# Patient Record
Sex: Female | Born: 1995 | Race: Black or African American | Hispanic: No | Marital: Single | Smoking: Never smoker
Health system: Southern US, Community
[De-identification: ages and names within clinical notes are randomized; demographics above are authoritative.]

## PROBLEM LIST (undated history)

## (undated) DIAGNOSIS — F909 Attention-deficit hyperactivity disorder, unspecified type: Secondary | ICD-10-CM

## (undated) DIAGNOSIS — F39 Unspecified mood [affective] disorder: Secondary | ICD-10-CM

---
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
## 2019-07-29 ENCOUNTER — Observation Stay (HOSPITAL_COMMUNITY)
Admission: EM | Admit: 2019-07-29 | Discharge: 2019-07-30 | Disposition: A | Discharge status: Home / Self Care | Payer: Self-pay | Attending: Orthopedic Surgery | Admitting: Orthopedic Surgery

## 2019-07-29 ENCOUNTER — Emergency Department (HOSPITAL_COMMUNITY): Payer: Self-pay

## 2019-07-29 ENCOUNTER — Encounter (HOSPITAL_COMMUNITY): Payer: Self-pay | Admitting: Emergency Medicine

## 2019-07-29 ENCOUNTER — Other Ambulatory Visit: Payer: Self-pay

## 2019-07-29 DIAGNOSIS — Y92481 Parking lot as the place of occurrence of the external cause: Secondary | ICD-10-CM | POA: Insufficient documentation

## 2019-07-29 DIAGNOSIS — W000XXA Fall on same level due to ice and snow, initial encounter: Secondary | ICD-10-CM | POA: Insufficient documentation

## 2019-07-29 DIAGNOSIS — S83106A Unspecified dislocation of unspecified knee, initial encounter: Secondary | ICD-10-CM | POA: Diagnosis present

## 2019-07-29 DIAGNOSIS — W19XXXA Unspecified fall, initial encounter: Secondary | ICD-10-CM

## 2019-07-29 DIAGNOSIS — Z79899 Other long term (current) drug therapy: Secondary | ICD-10-CM | POA: Insufficient documentation

## 2019-07-29 DIAGNOSIS — Z20822 Contact with and (suspected) exposure to covid-19: Secondary | ICD-10-CM | POA: Insufficient documentation

## 2019-07-29 DIAGNOSIS — S83104A Unspecified dislocation of right knee, initial encounter: Principal | ICD-10-CM | POA: Insufficient documentation

## 2019-07-29 DIAGNOSIS — F909 Attention-deficit hyperactivity disorder, unspecified type: Secondary | ICD-10-CM | POA: Insufficient documentation

## 2019-07-29 LAB — CBC
HCT: 40.2 % (ref 36.0–46.0)
Hemoglobin: 13.5 g/dL (ref 12.0–15.0)
MCH: 31.7 pg (ref 26.0–34.0)
MCHC: 33.6 g/dL (ref 30.0–36.0)
MCV: 94.4 fL (ref 80.0–100.0)
Platelets: 326 10*3/uL (ref 150–400)
RBC: 4.26 MIL/uL (ref 3.87–5.11)
RDW: 11.2 % — ABNORMAL LOW (ref 11.5–15.5)
WBC: 8.8 10*3/uL (ref 4.0–10.5)
nRBC: 0 % (ref 0.0–0.2)

## 2019-07-29 LAB — COMPREHENSIVE METABOLIC PANEL
ALT: 25 U/L (ref 0–44)
AST: 23 U/L (ref 15–41)
Albumin: 4.3 g/dL (ref 3.5–5.0)
Alkaline Phosphatase: 48 U/L (ref 38–126)
Anion gap: 8 (ref 5–15)
BUN: 7 mg/dL (ref 6–20)
CO2: 25 mmol/L (ref 22–32)
Calcium: 9 mg/dL (ref 8.9–10.3)
Chloride: 103 mmol/L (ref 98–111)
Creatinine, Ser: 0.63 mg/dL (ref 0.44–1.00)
GFR calc Af Amer: >60 mL/min (ref >60)
GFR calc non Af Amer: >60 mL/min (ref >60)
Glucose, Bld: 113 mg/dL — ABNORMAL HIGH (ref 70–99)
Potassium: 3.4 mmol/L — ABNORMAL LOW (ref 3.5–5.1)
Sodium: 136 mmol/L (ref 135–145)
Total Bilirubin: 0.5 mg/dL (ref 0.3–1.2)
Total Protein: 8.1 g/dL (ref 6.5–8.1)

## 2019-07-29 LAB — PROTIME-INR
INR: 1 (ref 0.8–1.2)
Prothrombin Time: 13.3 seconds (ref 11.4–15.2)

## 2019-07-29 MED ORDER — DOCUSATE SODIUM 100 MG PO CAPS
100.0000 mg | ORAL_CAPSULE | Freq: Two times a day (BID) | ORAL | Status: DC
  Administered 2019-07-29: 100 mg via ORAL
  Filled 2019-07-29: qty 1

## 2019-07-29 MED ORDER — TRAZODONE HCL 50 MG PO TABS
50.0000 mg | ORAL_TABLET | Freq: Every day | ORAL | Status: DC
  Administered 2019-07-29: 50 mg via ORAL
  Filled 2019-07-29: qty 1

## 2019-07-29 MED ORDER — ACETAMINOPHEN 500 MG PO TABS: 500.0000 mg | ORAL_TABLET | Freq: Four times a day (QID) | ORAL | Status: DC

## 2019-07-29 MED ORDER — HYDROCODONE-ACETAMINOPHEN 5-325 MG PO TABS: 1.0000 | ORAL_TABLET | ORAL | Status: DC | PRN

## 2019-07-29 MED ORDER — HYDROCODONE-ACETAMINOPHEN 7.5-325 MG PO TABS: 1.0000 | ORAL_TABLET | ORAL | Status: DC | PRN

## 2019-07-29 MED ORDER — DIPHENHYDRAMINE HCL 12.5 MG/5ML PO ELIX: 12.5000 mg | ORAL_SOLUTION | ORAL | Status: DC | PRN

## 2019-07-29 MED ORDER — TRAMADOL HCL 50 MG PO TABS
50.0000 mg | ORAL_TABLET | Freq: Four times a day (QID) | ORAL | Status: DC
  Administered 2019-07-30: 11:00:00 50 mg via ORAL
  Filled 2019-07-29: qty 1

## 2019-07-29 MED ORDER — OXCARBAZEPINE 150 MG PO TABS
150.0000 mg | ORAL_TABLET | Freq: Every day | ORAL | Status: DC
  Filled 2019-07-29 (×3): qty 1

## 2019-07-29 MED ORDER — METHOCARBAMOL 500 MG PO TABS: 500.0000 mg | ORAL_TABLET | Freq: Four times a day (QID) | ORAL | Status: DC | PRN

## 2019-07-29 MED ORDER — GABAPENTIN 300 MG PO CAPS
300.0000 mg | ORAL_CAPSULE | Freq: Three times a day (TID) | ORAL | Status: DC
  Administered 2019-07-29: 300 mg via ORAL
  Filled 2019-07-29: qty 1

## 2019-07-29 MED ORDER — METHOCARBAMOL 1000 MG/10ML IJ SOLN
500.0000 mg | Freq: Four times a day (QID) | INTRAVENOUS | Status: DC | PRN
  Filled 2019-07-29: qty 5

## 2019-07-29 MED ORDER — PROPOFOL 10 MG/ML IV BOLUS: 0.5000 mg/kg | Freq: Once | INTRAVENOUS | Status: AC

## 2019-07-29 MED ORDER — PROPOFOL 10 MG/ML IV BOLUS
INTRAVENOUS | Status: AC | PRN
  Administered 2019-07-29 (×3): 40 mg via INTRAVENOUS

## 2019-07-29 MED ORDER — HYDROMORPHONE HCL 1 MG/ML IJ SOLN
INTRAMUSCULAR | Status: AC
  Administered 2019-07-29: 1 mg via INTRAVENOUS
  Filled 2019-07-29: qty 1

## 2019-07-29 MED ORDER — PROPOFOL 10 MG/ML IV BOLUS
INTRAVENOUS | Status: AC
  Filled 2019-07-29: qty 20

## 2019-07-29 MED ORDER — ACETAMINOPHEN 325 MG PO TABS: 325.0000 mg | ORAL_TABLET | Freq: Four times a day (QID) | ORAL | Status: DC | PRN

## 2019-07-29 MED ORDER — MORPHINE SULFATE (PF) 2 MG/ML IV SOLN
0.5000 mg | INTRAVENOUS | Status: DC | PRN
  Administered 2019-07-29 – 2019-07-30 (×4): 1 mg via INTRAVENOUS
  Filled 2019-07-29 (×4): qty 1

## 2019-07-29 MED ORDER — LISDEXAMFETAMINE DIMESYLATE 10 MG PO CAPS: 30.0000 mg | ORAL_CAPSULE | Freq: Every day | ORAL | Status: DC

## 2019-07-29 MED ORDER — SODIUM CHLORIDE 0.9 % IV SOLN: INTRAVENOUS | Status: DC

## 2019-07-29 MED ORDER — HYDROMORPHONE HCL 1 MG/ML IJ SOLN: 1.0000 mg | Freq: Once | INTRAMUSCULAR | Status: AC

## 2019-07-29 MED ORDER — CELECOXIB 100 MG PO CAPS
200.0000 mg | ORAL_CAPSULE | Freq: Two times a day (BID) | ORAL | Status: DC
  Filled 2019-07-29 (×5): qty 1

## 2019-07-29 NOTE — H&P (Signed)
Jill Tran is an 24 y.o. female.   Chief Complaint: right knee pain  HPI: 24 yo female hyperextended the left knee presented to the ER with right knee pain and deformity   Initial films showed anterior knee dislocation   I advised to attempt immediate closed reduction which was successful   She has no neurological deficits papation of pulses are equal but faint   abi was 1.2 after reduction and doppler pulses are strong in DP and PT   Compartments are soft  Past Medical History:  Diagnosis Date  . ADHD (attention deficit hyperactivity disorder)   . Mood disorder ([facility])     History reviewed. No pertinent surgical history.  No family history on file. Social History:  reports that she has never smoked. She has never used smokeless tobacco. She reports current drug use. Drug: Marijuana. She reports that she does not drink alcohol.  Allergies: No Known Allergies  (Not in a hospital admission)   No results found for this or any previous visit (from the past 48 hour(s)). DG Knee 2 Views Right  Result Date: 07/29/2019 CLINICAL DATA:  Dislocation, postreduction. EXAM: RIGHT KNEE - 1-2 VIEW COMPARISON:  Radiograph earlier this day. FINDINGS: Previous posterior knee dislocation has been reduced. The alignment is currently anatomic. Previous patellar Barry Brunner is no longer seen. No visualized fracture. Small knee joint effusion. Generalized soft tissue edema. IMPRESSION: Reduction of posterior knee dislocation.  No visualized fracture. Electronically Signed   By: Narda Rutherford M.D.   On: 07/29/2019 23:01   DG Knee 1-2 Views Right  Result Date: 07/29/2019 CLINICAL DATA:  Slip on ice at [employer]. Fall with right lower leg deformity. EXAM: RIGHT KNEE - 1-2 VIEW COMPARISON:  None. FINDINGS: Posterior and lateral dislocation of the femur with respect to the tibia. Patellar [place], patella otherwise remains normally aligned. No evidence of acute fracture. IMPRESSION: Posterior and lateral dislocation  of the femur with respect to the tibia. No evidence of acute fracture. Recommend correlation with lower extremity pulse exam given increased incidence of vascular injury with knee dislocations. Electronically Signed   By: Narda Rutherford M.D.   On: 07/29/2019 22:19   DG Tibia/Fibula Right  Result Date: 07/29/2019 CLINICAL DATA:  Slip on ice at [employer]. Fall with right lower leg deformity. EXAM: RIGHT TIBIA AND FIBULA - 2 VIEW COMPARISON:  None. FINDINGS: Knee dislocation better assessed on concurrent knee exam. No evidence of fracture of the tibia or fibula. Ankle alignment better assessed on ankle exam. Generalized soft tissue edema. IMPRESSION: No acute fracture of the tibia or fibula. Knee and ankle alignment better assessed on dedicated joint exams performed concurrently. Electronically Signed   By: Narda Rutherford M.D.   On: 07/29/2019 22:22   DG Ankle 2 Views Right  Result Date: 07/29/2019 CLINICAL DATA:  Slip on ice at [employer]. Fall with right lower leg deformity. EXAM: RIGHT ANKLE - 2 VIEW COMPARISON:  None. FINDINGS: Question of subtalar dislocation, talocalcaneal alignment appears abnormal on the AP view, however alignment appears normal on the lateral view. No visualized fracture. No ankle joint effusion. Ankle mortise is preserved. IMPRESSION: Question of subtalar dislocation, talocalcaneal alignment appears abnormal on the AP view, however alignment appears normal on the lateral view. Recommend correlation with physical exam. Consider CT based on clinical concern. Electronically Signed   By: Narda Rutherford M.D.   On: 07/29/2019 22:24    Review of Systems  All other systems reviewed and are negative.   Blood pressure (!) 138/98,  pulse 60, temperature 98.6 F (37 C), temperature source Oral, resp. rate (!) 22, height 5\' 2"  (1.575 m), weight 113.4 kg, last menstrual period 07/29/2019, SpO2 100 %. Physical Exam  Constitutional: She is oriented to person, place, and time. She appears  well-developed. She appears distressed.  HENT:  Head: Normocephalic and atraumatic.  Right Ear: External ear normal.  Left Ear: External ear normal.  Mouth/Throat: No oropharyngeal exudate.  Eyes: Pupils are equal, round, and reactive to light. Conjunctivae and EOM are normal. Right eye exhibits no discharge. Left eye exhibits no discharge. Scleral icterus is present.  Neck: No JVD present. No tracheal deviation present.  Cardiovascular: Normal rate and intact distal pulses.  Respiratory: Effort normal. No stridor.  GI: Soft. She exhibits no distension. There is no abdominal tenderness. There is no rebound.  Musculoskeletal:     Right shoulder: No swelling, deformity, effusion, laceration, tenderness, bony tenderness, crepitus, pain or spasms. Normal range of motion. Normal strength. Normal pulse.     Left shoulder: Normal. No swelling, deformity, laceration, tenderness, bony tenderness, crepitus, pain or spasms. Normal range of motion. Normal strength. Normal pulse.     Cervical back: Normal range of motion and neck supple.     Right knee: Swelling, deformity, effusion and bony tenderness present. No erythema, ecchymosis or lacerations. Decreased range of motion. Tenderness present. Abnormal alignment.     Left knee: No swelling, effusion, erythema, ecchymosis, lacerations or bony tenderness. Normal range of motion. No tenderness. No medial joint line, lateral joint line, MCL, LCL or patellar tendon tenderness. No MCL laxity.Normal alignment, normal meniscus and normal patellar mobility.  Lymphadenopathy:    She has no cervical adenopathy.  Neurological: She is alert and oriented to person, place, and time. No cranial nerve deficit. She exhibits normal muscle tone. Coordination normal.  Skin: Skin is warm and dry. No rash noted. She is not diaphoretic. No erythema. No pallor.  Psychiatric: She has a normal mood and affect. Her behavior is normal. Judgment and thought content normal.      Assessment/Plan  Anterior knee dislocation is my interpretation of the xrays   Post reduction knee reduced  Interpretation of 2nd xrays    Right knee anterior knee dislocation   Observe for neurovascular status   Arther Abbott, MD 07/29/2019, 11:06 PM

## 2019-07-29 NOTE — ED Notes (Signed)
X-ray at bedside

## 2019-07-29 NOTE — ED Triage Notes (Signed)
At [facility], slid in ice legs went in front of you and down  Now with obvious deformities R lower leg

## 2019-07-29 NOTE — ED Provider Notes (Signed)
[facility] EMERGENCY DEPARTMENT Provider Note   CSN: [id number] Arrival date & time: 07/29/19  2129     History Chief Complaint  Patient presents with  . Fall  . Leg Pain    R    Jill Tran is a 24 y.o. female presenting with right lower leg and ankle pain with deformity since slipping on ice in a parking lot at a local store.  She describes the right leg sliding forward, then her upper body fell forward as well.  She is unsure of how her leg landed "it happened so fast".   She reports pain in her right lower leg that radiates to her knee when she flexes the right foot. She denies hip or back pain and denies head injury.  She has had no treatments prior to arrival for her injury.   HPI     Past Medical History:  Diagnosis Date  . ADHD (attention deficit hyperactivity disorder)   . Mood disorder ([place])     There are no problems to display for this patient.   History reviewed. No pertinent surgical history.   OB History   No obstetric history on file.     No family history on file.  Social History   Tobacco Use  . Smoking status: Never Smoker  . Smokeless tobacco: Never Used  Substance Use Topics  . Alcohol use: No  . Drug use: Yes    Types: Marijuana    Home Medications Prior to Admission medications   Medication Sig Start Date End Date Taking? Authorizing Provider  lisdexamfetamine (VYVANSE) 30 MG capsule Take 30 mg by mouth daily.   Yes [provider]  OXcarbazepine (TRILEPTAL) 150 MG tablet Take 150 mg by mouth daily.   Yes [provider]  traZODone (DESYREL) 50 MG tablet Take 50 mg by mouth at bedtime.   Yes [provider]  amoxicillin-clavulanate (AUGMENTIN) 875-125 MG per tablet Take 1 tablet by mouth 2 (two) times daily. 07/08/14   Harden Mo, MD  LACTOBACILLUS EXTRA STRENGTH CAPS Take 1 twice daily 07/10/14   Harden Mo, MD    Allergies    Patient has no known allergies.  Review of Systems   Review of  Systems  Constitutional: Negative.   HENT: Negative.   Respiratory: Negative.   Cardiovascular: Negative.   Gastrointestinal: Negative.   Musculoskeletal: Positive for arthralgias and joint swelling.  Skin: Negative for wound.  Neurological: Negative for weakness and numbness.    Physical Exam Updated Vital Signs BP 131/82   Pulse 69   Temp 98.6 F (37 C) (Oral)   Resp 18   Ht 5\' 2"  (1.575 m)   Wt 113.4 kg   LMP 07/29/2019   SpO2 100%   BMI 45.73 kg/m   Physical Exam Vitals and nursing note reviewed.  Constitutional:      Appearance: She is well-developed.  HENT:     Head: Normocephalic.  Cardiovascular:     Rate and Rhythm: Normal rate.     Pulses: No decreased pulses.          Dorsalis pedis pulses are 2+ on the right side and 2+ on the left side.       Posterior tibial pulses are 2+ on the right side and 2+ on the left side.  Musculoskeletal:        General: Tenderness present.     Right knee: Bony tenderness present. Tenderness present over the medial joint line, lateral joint line and patellar  tendon. Abnormal alignment.     Right ankle: No swelling or ecchymosis. Tenderness present. Decreased range of motion.     Comments:  Right foot held in internal rotation.  Skin:    General: Skin is warm and dry.  Neurological:     Mental Status: She is alert.     Sensory: No sensory deficit.     ED Results / Procedures / Treatments   Labs (all labs ordered are listed, but only abnormal results are displayed) Labs Reviewed  HCG, QUANTITATIVE, PREGNANCY    EKG None  Radiology DG Knee 1-2 Views Right  Result Date: 07/29/2019 CLINICAL DATA:  Slip on ice at [employer]. Fall with right lower leg deformity. EXAM: RIGHT KNEE - 1-2 VIEW COMPARISON:  None. FINDINGS: Posterior and lateral dislocation of the femur with respect to the tibia. Patellar [place], patella otherwise remains normally aligned. No evidence of acute fracture. IMPRESSION: Posterior and lateral dislocation  of the femur with respect to the tibia. No evidence of acute fracture. Recommend correlation with lower extremity pulse exam given increased incidence of vascular injury with knee dislocations. Electronically Signed   By: Narda Rutherford M.D.   On: 07/29/2019 22:19   DG Tibia/Fibula Right  Result Date: 07/29/2019 CLINICAL DATA:  Slip on ice at [employer]. Fall with right lower leg deformity. EXAM: RIGHT TIBIA AND FIBULA - 2 VIEW COMPARISON:  None. FINDINGS: Knee dislocation better assessed on concurrent knee exam. No evidence of fracture of the tibia or fibula. Ankle alignment better assessed on ankle exam. Generalized soft tissue edema. IMPRESSION: No acute fracture of the tibia or fibula. Knee and ankle alignment better assessed on dedicated joint exams performed concurrently. Electronically Signed   By: Narda Rutherford M.D.   On: 07/29/2019 22:22   DG Ankle 2 Views Right  Result Date: 07/29/2019 CLINICAL DATA:  Slip on ice at [employer]. Fall with right lower leg deformity. EXAM: RIGHT ANKLE - 2 VIEW COMPARISON:  None. FINDINGS: Question of subtalar dislocation, talocalcaneal alignment appears abnormal on the AP view, however alignment appears normal on the lateral view. No visualized fracture. No ankle joint effusion. Ankle mortise is preserved. IMPRESSION: Question of subtalar dislocation, talocalcaneal alignment appears abnormal on the AP view, however alignment appears normal on the lateral view. Recommend correlation with physical exam. Consider CT based on clinical concern. Electronically Signed   By: Narda Rutherford M.D.   On: 07/29/2019 22:24    Procedures Procedures (including critical care time)  Medications Ordered in ED Medications  HYDROmorphone (DILAUDID) injection 1 mg (1 mg Intravenous Not Given 07/29/19 2300)  propofol (DIPRIVAN) 10 mg/mL bolus/IV push 56.7 mg ( Intravenous Given 07/29/19 2250)  propofol (DIPRIVAN) 10 mg/mL bolus/IV push (40 mg Intravenous Given 07/29/19 2233)     ED Course  I have reviewed the triage vital signs and the nursing notes.  Pertinent labs & imaging results that were available during my care of the patient were reviewed by me and considered in my medical decision making (see chart for details).     MDM Rules/Calculators/A&P                       Pt with dislocated right knee joint.  Discussed with Dr. Romeo Apple who requests immediate reduction. He will see pt in ed.  Please refer to Dr. Sandi Carne note for conscious sedation and reduction notes.    Dr. Romeo Apple to admit pt.  Final Clinical Impression(s) / ED Diagnoses Final diagnoses:  Knee dislocation, right,  initial encounter    Rx / DC Orders ED Discharge Orders    None       Victoriano Lain 07/29/19 2302    Geoffery Lyons, MD 08/01/19 [phone]

## 2019-07-30 ENCOUNTER — Encounter (HOSPITAL_COMMUNITY): Payer: Self-pay | Admitting: Orthopedic Surgery

## 2019-07-30 LAB — HIV ANTIBODY (ROUTINE TESTING W REFLEX): HIV Screen 4th Generation wRfx: NONREACTIVE

## 2019-07-30 LAB — SARS CORONAVIRUS 2 (TAT 6-24 HRS): SARS Coronavirus 2: NEGATIVE

## 2019-07-30 LAB — HCG, QUANTITATIVE, PREGNANCY: hCG, Beta Chain, Quant, S: <1 m[IU]/mL (ref <5)

## 2019-07-30 MED ORDER — HYDROCODONE-ACETAMINOPHEN 5-325 MG PO TABS: 1.5000 | ORAL_TABLET | ORAL | Status: DC | PRN

## 2019-07-30 MED ORDER — HYDROCODONE-ACETAMINOPHEN 5-325 MG PO TABS: 1.0000 | ORAL_TABLET | ORAL | 0 refills | Status: DC | PRN

## 2019-07-30 MED ORDER — IBUPROFEN 800 MG PO TABS: 800.0000 mg | ORAL_TABLET | Freq: Three times a day (TID) | ORAL | 0 refills | Status: DC | PRN

## 2019-07-30 NOTE — ED Notes (Signed)
Pt at bedside

## 2019-07-30 NOTE — Discharge Instructions (Signed)
Knee Dislocation  Knee dislocation is when the bones that make up the knee joint move out of their normal positions. Usually, if your knee is dislocated, at least two of the strong cords that hold your bones in place (ligaments) are also damaged. This is a serious injury that is an emergency. Your doctor needs to put the knee bones back in place right away. This may be done with or without surgery. What are the causes?  A high-speed force that causes your knee to twist, bend, or extend more than the ligaments can withstand. It may happen from: ? A direct hit from a car accident or while playing sports. ? An injury, such as stepping in a hole in the ground and twisting your knee. What increases the risk?  Playing sports where you need to jump and change direction a lot, like basketball, gymnastics, soccer, and volleyball.  Playing contact sports like football, rugby, or lacrosse.  Having poor leg strength and flexibility.  Being born with extra looseness in your joints.  Being very overweight (obese). What are the signs or symptoms?  Knee pain.  One or more joint noises, like a pop or a snap, heard or felt at the time of the injury.  Knee swelling 1-2 hours after the injury.  A knee that looks like it is the wrong shape (deformed).  Not being able to move your knee (loss of motion).  Not being able to put weight on the knee.  Feeling like the knee is giving way.  A foot or ankle that feels numb, weak, cold, or is not its normal color. This may happen if you also have nerve or blood vessel damage. How is this treated?  Your doctor needs to put the knee bones back in place right away (reduction). This may be done with or without surgery, depending on how bad your injury is. ? Once the knee bones are back in place, your knee will be held in place by a splint or an immobilizer. ? You might have an exam (ultrasound or angiogram) to make sure that a blood vessel has not been  damaged. Follow these instructions at home: If you have a splint or knee immobilizer:   Wear it as told by your doctor. Remove it only as told by your doctor.  Loosen it if your foot and toes: ? Tingle. ? Become numb. ? Turn cold and blue.  Keep it clean and dry.  If the splint or immobilizer is not waterproof: ? Do not let it get wet. ? Cover it with a watertight covering when you take a bath or shower. Managing pain, stiffness, and swelling   If told, put ice on the injured area. ? If you have a removable splint or immobilizer, remove it as told by your doctor. ? Put ice in a plastic bag. ? Place a towel between your skin and the bag. ? Leave the ice on for 20 minutes, 2-3 times per day.  Move your toes often.  Raise (elevate) the injured area above the level of your heart while you are sitting or lying down. Activity  Do not use your injured leg to support your body weight until your doctor says that you can.  Use crutches or a scooter as told by your doctor.  Return to your normal activities as told by your doctor. Ask your doctor what activities are safe for you.  Avoid hard (strenuous) activities for as long as told by your doctor. General instructions  Take over-the-counter and prescription medicines only as told by your doctor.  Do not drive or use heavy machinery while taking prescription pain medicine.  If you were prescribed pain medicine, take steps to prevent or treat trouble pooping. Your doctor may suggest that you: ? Drink enough fluid to keep your pee (urine) pale yellow. ? Take over-the-counter or prescription medicines. ? Eat foods that are high in fiber. These include beans, whole grains, and fresh fruits and vegetables. ? Limit foods that are high in fat and sugar. These include fried or sweet foods. Contact a doctor if:  Your pain gets worse, not better. Get help right away if:  You have very bad pain.  You begin to lose feeling in your  foot and it does not help to loosen your splint or immobilizer.  Your foot or ankle turns blue or gray or feels cold.  You cannot move your ankle or toes.  You have chest pain or shortness of breath. Summary  Knee dislocation is when the bones that make up the knee joint move out of their normal positions.  This is treated by your doctor by putting the knee bones back in place right away (reduction).  Do not use your injured leg to support your body weight until your doctor says that you can.  Get help right away if your foot or ankle turns blue or gray or feels cold. This information is not intended to replace advice given to you by your health care provider. Make sure you discuss any questions you have with your health care provider. Document Revised: 11/17/2017 Document Reviewed: 11/17/2017 Elsevier Patient Education  2020 [employer].

## 2019-07-30 NOTE — Progress Notes (Signed)
Hospital day 2 status post right knee dislocation status post closed reduction  BP 128/88   Pulse 84   Temp 98.6 F (37 C) (Oral)   Resp (!) 33   Ht 5\' 2"  (1.575 m)   Wt 113.4 kg   LMP 07/29/2019   SpO2 99%   BMI 45.73 kg/m   The patient reports no numbness or tingling in the foot, she has mild to moderate knee pain.  She can perform straight leg raise in a brace  According to the nursing staff the patient has had no neurovascular deficits although no documentation is done in the record.  She has a faint dorsalis pedis and posterior tibial pulse but this is equal to the opposite side her foot is warm with good color she has full range of motion of the ankle all musculature is intact and there is normal sensation  We are waiting physical therapy to teach her how to weight-bear as tolerated in the brace and then she can be discharged

## 2019-07-30 NOTE — ED Notes (Signed)
Food given to patient. Tolerating well.

## 2019-07-30 NOTE — ED Notes (Signed)
Spoke with Arline Asp Russell(on-call for PT) who will see Pt around 1:30, 2p.  Nurse informed.

## 2019-07-30 NOTE — Progress Notes (Signed)
Physical Therapy Evaluation Patient Details Name: Jill Tran MRN: [id number] DOB: 1996/02/15 Today's Date: 07/30/2019   History of Present Illness   PT fx Rt patella due to a mechanical fall  Clinical Impression  PT fatigues easily but should to well with crutches.     Follow Up Recommendations  OP  PT    Equipment Recommendations    crutches given to pt           Mobility  Bed Mobility  MOD I                   Transfers Mod I                    Ambulation/Gait    Amb with TTWB x 20 ft x 1; x 40 ft x 1            Stairs    Therapist recommends pt going up and down on bottom                 Pertinent Vitals/Pain  5/10 treatment limited by pain     Home Living  Stairs to go up into home.                       Prior Function   I                        Cognition  I                                                    Assessment/Plan   PT will need OP therapy for follow up   PT Assessment    PT Problem List   Difficulty in walking, pain       PT Treatment Interventions   Gt training   PT Goals (Current goals can be found in the Care Plan section)   PT to be I in ambulation with least assistive device.     Frequency  PT to be D/C today to home    Barriers to discharge    none          End of Session              Time: 12:30 -13:15      Charges:  eval             Virgina Organ, PT CLT [phone] 07/30/2019, 2:13 PM

## 2019-07-30 NOTE — Discharge Summary (Signed)
Discharge summary  Admission date January 29th 2021  Admission diagnoses dislocation right knee  Discharge diagnoses same  Procedure closed reduction of right knee dislocation in the emergency room  24 year old female fell hyperextended her right knee presented to the emergency room department with a deformity of the right knee.  Radiographs confirmed a anterior dislocation.  She had a closed reduction under sedation in the emergency room and was kept for observation for neurovascular checks  Her ABI was 1.2 after reduction  She remained stable throughout the hospital course with no neurovascular deficits pre or post reduction or during observation.  Covid test was negative  Discharge examination the patient was able to form straight leg raise with the brace on she had normal neurovascular examination of the right lower extremity and compartments were soft BP 128/88   Pulse 84   Temp 98.6 F (37 C) (Oral)   Resp (!) 33   Ht 5\' 2"  (1.575 m)   Wt 113.4 kg   LMP 07/29/2019   SpO2 99%   BMI 45.73 kg/m

## 2019-07-30 NOTE — Discharge Summary (Signed)
Physician Discharge Summary  Patient ID: Jill Tran MRN: [id number] DOB/AGE: 1996/01/04 24 y.o.  Active Problems:   Knee dislocation  Discharge summary  Admission date January 29th 2021  Admission diagnoses dislocation right knee  Discharge diagnoses same  Procedure closed reduction of right knee dislocation in the emergency room  24 year old female fell hyperextended her right knee presented to the emergency room department with a deformity of the right knee.  Radiographs confirmed a anterior dislocation.  She had a closed reduction under sedation in the emergency room and was kept for observation for neurovascular checks  Her ABI was 1.2 after reduction  She remained stable throughout the hospital course with no neurovascular deficits pre or post reduction or during observation.  Covid test was negative  Discharge examination the patient was able to form straight leg raise with the brace on she had normal neurovascular examination of the right lower extremity and compartments were soft BP 128/88   Pulse 84   Temp 98.6 F (37 C) (Oral)   Resp (!) 33   Ht 5\' 2"  (1.575 m)   Wt 113.4 kg   LMP 07/29/2019   SpO2 99%   BMI 45.73 kg/m    Disposition: Discharge disposition: 01-Home or Self Care       Discharge Instructions    Call MD / Call 911   Complete by: As directed    If you experience chest pain or shortness of breath, CALL 911 and be transported to the hospital emergency room.  If you develope a fever above 101 F, pus (white drainage) or increased drainage or redness at the wound, or calf pain, call your surgeon's office.   Constipation Prevention   Complete by: As directed    Drink plenty of fluids.  Prune juice may be helpful.  You may use a stool softener, such as Colace (over the counter) 100 mg twice a day.  Use MiraLax (over the counter) for constipation as needed.   Diet - low sodium heart healthy   Complete by: As directed    Discharge instructions    Complete by: As directed    Wear right leg knee brace at all times  Take a bubble bath with a washcloth until you see the doctor  Follow instructions as given by physical therapy you are allowed to place weight on your leg as long as the brace is on  If the foot becomes cold discolored blue feels funny please call [phone] ask for Dr. [phone]   Increase activity slowly as tolerated   Complete by: As directed      Allergies as of 07/30/2019   No Known Allergies     Medication List    STOP taking these medications   amoxicillin-clavulanate 875-125 MG tablet Commonly known as: Augmentin     TAKE these medications   HYDROcodone-acetaminophen 5-325 MG tablet Commonly known as: NORCO/VICODIN Take 1 tablet by mouth every 4 (four) hours as needed for moderate pain.   ibuprofen 800 MG tablet Commonly known as: ADVIL Take 1 tablet (800 mg total) by mouth every 8 (eight) hours as needed.   Lactobacillus Extra Strength Caps Take 1 twice daily   lisdexamfetamine 30 MG capsule Commonly known as: VYVANSE Take 30 mg by mouth daily.   OXcarbazepine 150 MG tablet Commonly known as: TRILEPTAL Take 150 mg by mouth daily.   traZODone 50 MG tablet Commonly known as: DESYREL Take 50 mg by mouth at bedtime.      Follow-up Information  Carole Civil, MD Follow up on 08/05/2019.   Specialties: Orthopedic Surgery, Radiology Contact information: [address] [place] [address] [phone]           Signed: Arther Abbott 07/30/2019, 1:26 PM

## 2019-08-01 NOTE — ED Provider Notes (Signed)
Patient seen in conjunction with PA Burgess Amor.  For additional information, please see her note and my cosign note.   Physical Exam  BP 131/86   Pulse 89   Temp 98.6 F (37 C)   Resp 19   Ht 5\' 2"  (1.575 m)   Wt 113.4 kg   LMP 07/29/2019   SpO2 100%   BMI 45.73 kg/m     ED Course/Procedures     .Sedation  Date/Time: 08/01/2019 3:07 PM Performed by: 09/29/2019, MD Authorized by: Geoffery Lyons, MD   Consent:    Consent obtained:  Verbal   Consent given by:  Patient   Risks discussed:  Allergic reaction, dysrhythmia, inadequate sedation, nausea, prolonged hypoxia resulting in organ damage, prolonged sedation necessitating reversal, respiratory compromise necessitating ventilatory assistance and intubation and vomiting   Alternatives discussed:  Analgesia without sedation, anxiolysis and regional anesthesia Universal protocol:    Procedure explained and questions answered to patient or proxy's satisfaction: yes     Relevant documents present and verified: yes     Test results available and properly labeled: yes     Imaging studies available: yes     Required blood products, implants, devices, and special equipment available: yes     Site/side marked: yes     Immediately prior to procedure a time out was called: yes     Patient identity confirmation method:  Verbally with patient Indications:    Procedure necessitating sedation performed by:  Physician performing sedation Pre-sedation assessment:    Time since last food or drink:  3 hours   ASA classification: class 1 - normal, healthy patient     Neck mobility: normal     Mouth opening:  3 or more finger widths   Thyromental distance:  4 finger widths   Mallampati score:  I - soft palate, uvula, fauces, pillars visible   Pre-sedation assessments completed and reviewed: airway patency, cardiovascular function, hydration status, mental status, nausea/vomiting, pain level, respiratory function and temperature   Immediate  pre-procedure details:    Reassessment: Patient reassessed immediately prior to procedure     Reviewed: vital signs, relevant labs/tests and NPO status     Verified: bag valve mask available, emergency equipment available, intubation equipment available, IV patency confirmed, oxygen available and suction available   Procedure details (see MAR for exact dosages):    Preoxygenation:  Nasal cannula   Sedation:  Propofol   Intended level of sedation: deep   Intra-procedure monitoring:  Blood pressure monitoring, cardiac monitor, continuous pulse oximetry, frequent LOC assessments, frequent vital sign checks and continuous capnometry   Intra-procedure events: none     Total Provider sedation time (minutes):  10 Post-procedure details:    Attendance: Constant attendance by certified staff until patient recovered     Recovery: Patient returned to pre-procedure baseline     Post-sedation assessments completed and reviewed: airway patency, cardiovascular function, hydration status, mental status, nausea/vomiting, pain level, respiratory function and temperature     Patient is stable for discharge or admission: yes     Patient tolerance:  Tolerated well, no immediate complications Comments:     Patient achieved good sedation with 80 mg of propofol.  Knee easily reduced. Reduction of dislocation  Date/Time: 08/01/2019 3:08 PM Performed by: 09/29/2019, MD Authorized by: Geoffery Lyons, MD  Consent: The procedure was performed in an emergent situation. Verbal consent obtained. Written consent obtained. Risks and benefits: risks, benefits and alternatives were discussed Consent given by: patient Patient understanding:  patient states understanding of the procedure being performed Patient consent: the patient's understanding of the procedure matches consent given Procedure consent: procedure consent matches procedure scheduled Relevant documents: relevant documents present and verified Test results:  test results available and properly labeled Site marked: the operative site was marked Imaging studies: imaging studies available Required items: required blood products, implants, devices, and special equipment available Patient identity confirmed: verbally with patient, arm band and hospital-assigned identification number Time out: Immediately prior to procedure a "time out" was called to verify the correct patient, procedure, equipment, support staff and site/side marked as required. Local anesthesia used: no  Anesthesia: Local anesthesia used: no  Sedation: Patient sedated: yes Sedation type: moderate (conscious) sedation Sedatives: propofol Analgesia: hydromorphone  Patient tolerance: patient tolerated the procedure well with no immediate complications Comments: Patient sedated with propofol and reduction easily performed.  Successful reduction confirmed with post reduction films.     MDM         Veryl Speak, MD 08/01/19 1510

## 2019-08-05 ENCOUNTER — Encounter: Payer: Self-pay | Admitting: Orthopedic Surgery

## 2019-08-05 ENCOUNTER — Ambulatory Visit (INDEPENDENT_AMBULATORY_CARE_PROVIDER_SITE_OTHER): Payer: Self-pay | Admitting: Orthopedic Surgery

## 2019-08-05 ENCOUNTER — Other Ambulatory Visit: Payer: Self-pay

## 2019-08-05 VITALS — BP 128/83 | HR 69 | Temp 97.3°F | Ht 62.0 in | Wt 250.0 lb

## 2019-08-05 DIAGNOSIS — S83104D Unspecified dislocation of right knee, subsequent encounter: Secondary | ICD-10-CM

## 2019-08-05 MED ORDER — IBUPROFEN 800 MG PO TABS: 800.0000 mg | ORAL_TABLET | Freq: Three times a day (TID) | ORAL | 0 refills | Status: DC | PRN

## 2019-08-05 MED ORDER — HYDROCODONE-ACETAMINOPHEN 5-325 MG PO TABS: 1.0000 | ORAL_TABLET | ORAL | 0 refills | Status: DC | PRN

## 2019-08-05 NOTE — Progress Notes (Signed)
Chief Complaint  Patient presents with  . Knee Injury    07/29/19 right knee dislocation     23 low energy knee dislocation right knee.  The dislocation was anterior.  Patient is doing well has mild discomfort to moderate discomfort but is noted to have good pain control on Norco and Advil  She is in a knee immobilizer  She is on crutches  She is weightbearing as tolerated although she has been putting minimal weight on the right knee  Reexamination of the knee today shows that she does have decreased sensation on the lateral portion of the leg and the superficial peroneal nerve distribution but has intact motor function of all distal nerves  Pulse and perfusion color normal no motor deficit  Recommend weightbearing as tolerated in a long-leg brace  Follow-up in 3 weeks for x-ray reexamination and consideration of MRI  Meds ordered this encounter  Medications  . HYDROcodone-acetaminophen (NORCO/VICODIN) 5-325 MG tablet    Sig: Take 1 tablet by mouth every 4 (four) hours as needed for moderate pain.    Dispense:  20 tablet    Refill:  0  . ibuprofen (ADVIL) 800 MG tablet    Sig: Take 1 tablet (800 mg total) by mouth every 8 (eight) hours as needed.    Dispense:  30 tablet    Refill:  0   Encounter Diagnosis  Name Primary?  . Dislocation of right knee, subsequent encounter 07/29/19 Yes   Acute complicated injury prescription management

## 2019-08-05 NOTE — Patient Instructions (Signed)
Fu 2 weeks   Weight bearing in the brace

## 2019-08-26 ENCOUNTER — Ambulatory Visit: Payer: Self-pay

## 2019-08-26 ENCOUNTER — Other Ambulatory Visit: Payer: Self-pay

## 2019-08-26 ENCOUNTER — Other Ambulatory Visit: Payer: Self-pay | Admitting: Orthopedic Surgery

## 2019-08-26 ENCOUNTER — Encounter: Payer: Self-pay | Admitting: Orthopedic Surgery

## 2019-08-26 ENCOUNTER — Ambulatory Visit (INDEPENDENT_AMBULATORY_CARE_PROVIDER_SITE_OTHER): Payer: Self-pay | Admitting: Orthopedic Surgery

## 2019-08-26 VITALS — BP 144/102 | HR 71 | Temp 97.4°F | Ht 62.0 in | Wt 234.6 lb

## 2019-08-26 DIAGNOSIS — S83104D Unspecified dislocation of right knee, subsequent encounter: Secondary | ICD-10-CM

## 2019-08-26 MED ORDER — IBUPROFEN 800 MG PO TABS: 800.0000 mg | ORAL_TABLET | Freq: Three times a day (TID) | ORAL | 0 refills | Status: DC | PRN

## 2019-08-26 NOTE — Addendum Note (Signed)
Addended by: Vickki Hearing on: 08/26/2019 09:56 AM   Modules accepted: Orders

## 2019-08-26 NOTE — Patient Instructions (Signed)
You have been scheduled for an MRI scan  We will call your insurance company to do a precertification to get the MRI covered  You will receive a phone call regarding the date of the scan  You will return for MRI results

## 2019-08-26 NOTE — Progress Notes (Signed)
Chief Complaint  Patient presents with  . Knee Problem    right knee follow up is better just worse when its cold    doi 07/29/2019  Right knee dislocation  Patient is in good condition no neurovascular deficits just right knee pain she is ambulatory with a straight leg brace and crutches  She feels unstable in anterior posterior plane her x-ray today showed no recurrence of the dislocation  She will need an MRI of the knee  She will continue with weightbearing as tolerated in the brace until she returns  Encounter Diagnosis  Name Primary?  . Dislocation of right knee, subsequent encounter Yes

## 2019-09-29 ENCOUNTER — Other Ambulatory Visit: Payer: Self-pay

## 2019-09-29 ENCOUNTER — Ambulatory Visit (HOSPITAL_COMMUNITY)
Admission: RE | Admit: 2019-09-29 | Discharge: 2019-09-29 | Disposition: A | Discharge status: Home / Self Care | Payer: Self-pay | Source: Ambulatory Visit | Attending: Orthopedic Surgery | Admitting: Orthopedic Surgery

## 2019-09-29 DIAGNOSIS — S83104D Unspecified dislocation of right knee, subsequent encounter: Secondary | ICD-10-CM | POA: Insufficient documentation

## 2019-10-14 ENCOUNTER — Other Ambulatory Visit: Payer: Self-pay

## 2019-10-14 ENCOUNTER — Ambulatory Visit (INDEPENDENT_AMBULATORY_CARE_PROVIDER_SITE_OTHER): Payer: Self-pay | Admitting: Orthopedic Surgery

## 2019-10-14 VITALS — Temp 97.3°F | Ht 62.0 in | Wt 234.0 lb

## 2019-10-14 DIAGNOSIS — Z6841 Body Mass Index (BMI) 40.0 and over, adult: Secondary | ICD-10-CM

## 2019-10-14 DIAGNOSIS — S83104D Unspecified dislocation of right knee, subsequent encounter: Secondary | ICD-10-CM

## 2019-10-14 MED ORDER — IBUPROFEN 800 MG PO TABS: 800.0000 mg | ORAL_TABLET | Freq: Three times a day (TID) | ORAL | 1 refills | Status: DC | PRN

## 2019-10-14 NOTE — Progress Notes (Addendum)
Chief Complaint  Patient presents with  . Follow-up    Recheck on right knee.   The patient meets the AMA guidelines for Morbid (severe) obesity with a BMI > 40.0 and I have recommended weight loss.  Patient complains of stiffness in the right knee otherwise doing well    Encounter Diagnoses  Name Primary?  . Body mass index 40.0-44.9, adult (HCC)   . Morbid obesity (HCC)   . Dislocation of right knee, subsequent encounter Yes    Temp (!) 97.3 F (36.3 C)   Ht 5\' 2"  (1.575 m)   Wt 234 lb (106.1 kg)   BMI 42.80 kg/m   The joint still is swollen she has full extension but she is to hyperextend her on the other side.  Her knee flexion is about 85 degrees.  Her medial collateral ligament was stable in extension and flexion.  The lateral collateral ligament have mild laxity at 30 degrees.  She has AP laxity but her exam is difficult because of muscle guarding  Several things are in play here.   #1 age #2 BMI #3 low energy knee dislocations do have some success with rehab and bracing  Start therapy  Range of motion brace 0-90 advance as tolerated  Come back in 6 weeks  Meds ordered this encounter  Medications  . ibuprofen (ADVIL) 800 MG tablet    Sig: Take 1 tablet (800 mg total) by mouth every 8 (eight) hours as needed.    Dispense:  90 tablet    Refill:  1

## 2019-10-14 NOTE — Addendum Note (Signed)
Addended by: Fuller Canada E on: 10/14/2019 10:59 AM   Modules accepted: Orders, Level of Service

## 2019-10-14 NOTE — Patient Instructions (Addendum)
Wear your brace for all activity you do not have to sleep in the brace   Start physical therapy   Take Tylenol for pain or ibuprofen   Continue to ice the knee 3-4 times a day for 30 minutes   BMI for Adults What is BMI? Body mass index (BMI) is a number that is calculated from a person's weight and height. BMI can help estimate how much of a person's weight is composed of fat. BMI does not measure body fat directly. Rather, it is an alternative to procedures that directly measure body fat, which can be difficult and expensive. BMI can help identify people who may be at higher risk for certain medical problems. What are BMI measurements used for? BMI is used as a screening tool to identify possible weight problems. It helps determine whether a person is obese, overweight, a healthy weight, or underweight. BMI is useful for:  Identifying a weight problem that may be related to a medical condition or may increase the risk for medical problems.  Promoting changes, such as changes in diet and exercise, to help reach a healthy weight. BMI screening can be repeated to see if these changes are working. How is BMI calculated? BMI involves measuring your weight in relation to your height. Both height and weight are measured, and the BMI is calculated from those numbers. This can be done either in [place] ([place] or metric measurements. Note that charts and online BMI calculators are available to help you find your BMI quickly and easily without having to do these calculations yourself. To calculate your BMI in English (U.S.) measurements:  1. Measure your weight in pounds (lb). 2. Multiply the number of pounds by 703. ? For example, for a person who weighs 180 lb, multiply that number by 703, which equals 126,540. 3. Measure your height in inches. Then multiply that number by itself to get a measurement called "inches squared." ? For example, for a person who is 70 inches tall, the "inches squared"  measurement is 70 inches x 70 inches, which equals 4,900 inches squared. 4. Divide the total from step 2 (number of lb x 703) by the total from step 3 (inches squared): 126,540  4,900 = 25.8. This is your BMI. To calculate your BMI in metric measurements: 1. Measure your weight in kilograms (kg). 2. Measure your height in meters (m). Then multiply that number by itself to get a measurement called "meters squared." ? For example, for a person who is 1.75 m tall, the "meters squared" measurement is 1.75 m x 1.75 m, which is equal to 3.1 meters squared. 3. Divide the number of kilograms (your weight) by the meters squared number. In this example: 70  3.1 = 22.6. This is your BMI. What do the results mean? BMI charts are used to identify whether you are underweight, normal weight, overweight, or obese. The following guidelines will be used:  Underweight: BMI less than 18.5.  Normal weight: BMI between 18.5 and 24.9.  Overweight: BMI between 25 and 29.9.  Obese: BMI of 30 or above. Keep these notes in mind:  Weight includes both fat and muscle, so someone with a muscular build, such as an athlete, may have a BMI that is higher than 24.9. In cases like these, BMI is not an accurate measure of body fat.  To determine if excess body fat is the cause of a BMI of 25 or higher, further assessments may need to be done by a health care provider.  BMI is usually interpreted in the same way for men and women. Where to find more information For more information about BMI, including tools to quickly calculate your BMI, go to these websites:  Centers for Disease Control and Prevention: FootballExhibition.com.br  American Heart Association: www.heart.org  National Heart, Lung, and Blood Institute: PopSteam.is Summary  Body mass index (BMI) is a number that is calculated from a person's weight and height.  BMI may help estimate how much of a person's weight is composed of fat. BMI can help identify those  who may be at higher risk for certain medical problems.  BMI can be measured using English measurements or metric measurements.  BMI charts are used to identify whether you are underweight, normal weight, overweight, or obese. This information is not intended to replace advice given to you by your health care provider. Make sure you discuss any questions you have with your health care provider. Document Revised: 03/09/2019 Document Reviewed: 01/14/2019 Elsevier Patient Education  2020 [employer].

## 2019-10-14 NOTE — Addendum Note (Signed)
Addended byCaffie Damme on: 10/14/2019 11:43 AM   Modules accepted: Orders

## 2019-10-27 ENCOUNTER — Other Ambulatory Visit: Payer: Self-pay

## 2019-10-27 ENCOUNTER — Ambulatory Visit (HOSPITAL_COMMUNITY): Payer: Self-pay | Attending: Orthopedic Surgery | Admitting: Physical Therapy

## 2019-10-27 DIAGNOSIS — M25561 Pain in right knee: Secondary | ICD-10-CM | POA: Insufficient documentation

## 2019-10-27 DIAGNOSIS — M25661 Stiffness of right knee, not elsewhere classified: Secondary | ICD-10-CM | POA: Insufficient documentation

## 2019-10-27 DIAGNOSIS — R262 Difficulty in walking, not elsewhere classified: Secondary | ICD-10-CM | POA: Insufficient documentation

## 2019-10-27 NOTE — Therapy (Signed)
[facility] [address] [place], [place], [address] Phone: [phone]   Fax:  [phone]  Physical Therapy Evaluation  Patient Details  Name: Jill Tran MRN: [id number] Date of Birth: 19-Jul-1995 Referring Provider (PT): Fuller Canada   ROM: 0-100 Ambulates with brace no assistive device   Encounter Date: 10/27/2019  PT End of Session - 10/27/19 1348    Visit Number  1    Number of Visits  6    Date for PT Re-Evaluation  12/08/19    Progress Note Due on Visit  10    PT Start Time  1300    PT Stop Time  1352    PT Time Calculation (min)  52 min    Activity Tolerance  Patient tolerated treatment well    Behavior During Therapy  [facility] for tasks assessed/performed       Past Medical History:  Diagnosis Date  . ADHD (attention deficit hyperactivity disorder)   . Mood disorder (HCC)     No past surgical history on file.  There were no vitals filed for this visit.   Subjective Assessment - 10/27/19 1257    Subjective  Jill Tran states that she fell on 07/29/2019 and dislocated her RT knee.  She has had continued pain and difficulty bending her knee ever since, therefore an MRI was ordered which showed torn ligaments.   She has been placed into a knee brace which is locked to 90 degrees which she wears when she is up.    Limitations  Walking;Standing;Sitting;Reading;House hold activities    How long can you sit comfortably?  an hour    How long can you stand comfortably?  15    How long can you walk comfortably?  20-30 minutes    Diagnostic tests  MGN:[id number]:1. Complete tears of the ACL and PCL2. Complete or near-complete tear of the proximal fibular collateralligament with associated avulsion fracture. The avulsed fragment isnot clearly delineated by MRI.3. Grade 1-2 MCL sprain.4. Intact menisci.  Discoid lateral meniscus.5. Small to moderate knee joint effusion.    Patient Stated Goals  improved stability in her knee    Currently in Pain?  Yes    Pain Score  6     Pain Location  Knee    Pain Orientation  Right;Anterior    Pain Descriptors / Indicators  Aching;Tightness;Throbbing    Pain Type  Chronic pain    Pain Onset  More than a month ago    Pain Frequency  Intermittent    Aggravating Factors   not sure    Pain Relieving Factors  tylenol    Effect of Pain on Daily Activities  limits         [facility] PT Assessment - 10/27/19 0001      Assessment   Medical Diagnosis  Rt patella dislocation     Referring Provider (PT)  Fuller Canada     Onset Date/Surgical Date  07/29/19    Prior Therapy  none      Precautions   Precautions  None      Restrictions   Weight Bearing Restrictions  No      Balance Screen   Has the patient fallen in the past 6 months  Yes    How many times?  1    Has the patient had a decrease in activity level because of a fear of falling?   Yes    Is the patient reluctant to leave their home because of  a fear of falling?   No      Home Public house manager residence    Home Access  Stairs to enter    Entrance Stairs-Number of Steps  4   one step at a time; slow    Entrance Stairs-Rails  Right    Home Layout  One level      Prior Function   Level of Independence  Independent    Vocation  Unemployed    Leisure  walk, play with her god babbie      Cognition   Overall Cognitive Status  Within Functional Limits for tasks assessed      Observation/Other Assessments   Focus on Therapeutic Outcomes (FOTO)   53 ; 47% disable       Functional Tests   Functional tests  Single leg stance;Sit to Stand      Single Leg Stance   Comments  Lt :  19 "  ; RT: 12"      Sit to Stand   Comments  6 in 30 seconds    with brace;      ROM / Strength   AROM / PROM / Strength  AROM;Strength      AROM   AROM Assessment Site  Knee    Right/Left Knee  Right    Right Knee Extension  0    Right Knee Flexion  100      Strength   Strength Assessment Site   Hip;Knee;Ankle    Right/Left Hip  Right    Right Hip Flexion  3+/5    Right Hip Extension  4/5    Right Hip ABduction  3-/5    Right/Left Knee  Right    Right Knee Flexion  2+/5    Right Knee Extension  3/5    Right/Left Ankle  Right    Right Ankle Dorsiflexion  4/5                Objective measurements completed on examination: See above findings.      [facility] Adult PT Treatment/Exercise - 10/27/19 0001      Exercises   Exercises  Knee/Hip      Knee/Hip Exercises: Standing   Heel Raises  Both;10 reps    Functional Squat  10 reps      Knee/Hip Exercises: Seated   Long Arc Quad  10 reps      Knee/Hip Exercises: Supine   Quad Sets  Right;10 reps    Heel Slides  5 reps    Straight Leg Raises  5 reps      Knee/Hip Exercises: Sidelying   Hip ABduction  10 reps      Knee/Hip Exercises: Prone   Hamstring Curl  10 reps    Hamstring Curl Limitations  mini    Hip Extension  10 reps             PT Education - 10/27/19 1347    Education Details  HEP    Person(s) Educated  Patient    Methods  Explanation;Handout    Comprehension  Returned demonstration       PT Short Term Goals - 10/27/19 1404      PT SHORT TERM GOAL #1   Title  PT to be I in Hep to improve stability of  Rt knee to decrease pain to no greater than a 4/10.    Time  3    Period  Weeks    Status  New    Target Date  11/17/19      PT SHORT TERM GOAL #2   Title  Pt Rt LE strength to be increased  to be able to come from sit to stand without UE assist without shifting wt to Lt side with ease    Time  3    Period  Weeks    Status  New        PT Long Term Goals - 10/27/19 1407      PT LONG TERM GOAL #1   Title  PT Rt Knee pain to be no greater than a 2/10 to allow pt to ambulate for over 30 minutes without increased pain    Time  6    Period  Weeks    Status  New    Target Date  12/01/19      PT LONG TERM GOAL #2   Title  PT Rt knee ROM to be to 115 to be able to sit in a car  with comfort for more than 2 hours    Time  6    Period  Weeks    Status  New      PT LONG TERM GOAL #3   Title  PT strength in RT knee to be increased to at least a 4/5 to allow pt to be able to ascend and descend 8 steps in a reciprocal manner.             Plan - 10/27/19 1354    Clinical Impression Statement  Jill Tran is a 24 yo femal who fell on 07/29/2019 injuring her Rt knee.  She went to the ER where her patella was dislocated.  The MD was able to successfully relocate her patella.  Recent MRI demonstrates total rupture of the Pt ACL and PCL.  She has been placed in a knee brace with limitation of 90 degree flexion and is being referred to skilled PT>  At this time the pt is self pay and desires to come once a week with HEP    Personal Factors and Comorbidities  Fitness;Time since onset of injury/illness/exacerbation    Examination-Activity Limitations  Bend;Locomotion Level;Squat;Stairs;Stand;Lift    Examination-Participation Restrictions  Cleaning;Community Activity;Laundry;Yard Work;Shop    Stability/Clinical Decision Making  Stable/Uncomplicated    Clinical Decision Making  Low    Rehab Potential  Fair    PT Frequency  1x / week    PT Duration  6 weeks    PT Treatment/Interventions  Patient/family education;Stair training;Functional mobility training;Therapeutic activities;Therapeutic exercise;Balance training;Manual techniques    PT Next Visit Plan  begin Single leg stance, single heel raise, standing terminal knee extension with towel roll so pt can mimic at home, attempt step ups progess each week giving pt HEP to complete,( pt has no insurance so will only be coming once a week)    PT Home Exercise Plan  heelraises, functional squat, LAQ, Qset, heelside, SLR, sidelying abduction, prone knee flexion (mini as knee becomes unstable, prone hip extension       Patient will benefit from skilled therapeutic intervention in order to improve the following deficits and  impairments:  Decreased activity tolerance, Decreased balance, Decreased range of motion, Difficulty walking, Decreased strength, Pain, Obesity  Visit Diagnosis: Stiffness of right knee, not elsewhere classified - Plan: PT plan of care cert/re-cert  Acute pain of right knee - Plan: PT plan of care cert/re-cert  Difficulty in walking, not elsewhere classified - Plan: PT plan of care  cert/re-cert     Problem List Patient Active Problem List   Diagnosis Date Noted  . Knee dislocation 07/29/19 07/29/2019    Delories Mauri,CINDY 10/27/2019, 2:13 PM   [facility] [address] [place], [place], [address] Phone: [phone]   Fax:  [phone]  Name: Jill Tran MRN: [id number] Date of Birth: Mar 14, 1996

## 2019-11-02 ENCOUNTER — Ambulatory Visit (HOSPITAL_COMMUNITY): Payer: Self-pay | Attending: Orthopedic Surgery | Admitting: Physical Therapy

## 2019-11-02 ENCOUNTER — Other Ambulatory Visit: Payer: Self-pay

## 2019-11-02 DIAGNOSIS — M25661 Stiffness of right knee, not elsewhere classified: Secondary | ICD-10-CM | POA: Insufficient documentation

## 2019-11-02 DIAGNOSIS — M25561 Pain in right knee: Secondary | ICD-10-CM | POA: Insufficient documentation

## 2019-11-02 DIAGNOSIS — R262 Difficulty in walking, not elsewhere classified: Secondary | ICD-10-CM | POA: Insufficient documentation

## 2019-11-02 NOTE — Patient Instructions (Signed)
Access Code: [email or web address] URL: [email or web address] Date: 11/02/2019 Prepared by: Emeline Gins  Exercises Standing Terminal Knee Extension at [employer] with [employer] - 2 x daily - 7 x weekly - 2 sets - 10 reps Single Leg Stance - 2 x daily - 7 x weekly - 3 sets - 15-30 sec hold Single Leg Heel Raise with Unilateral Counter Support - 2 x daily - 7 x weekly - 1 sets - 10 reps Forward Step Up - 2 x daily - 7 x weekly - 1 sets - 10 reps

## 2019-11-02 NOTE — Therapy (Signed)
[facility] [address] [place], [place], [address] Phone: [phone]   Fax:  [phone]  Physical Therapy Treatment  Patient Details  Name: Jill Tran MRN: [id number] Date of Birth: 1995-12-29 Referring Provider (PT): Fuller Canada    Encounter Date: 11/02/2019  PT End of Session - 11/02/19 1403    Visit Number  2    Number of Visits  6    Date for PT Re-Evaluation  12/08/19    Progress Note Due on Visit  10    PT Start Time  1318    PT Stop Time  1400    PT Time Calculation (min)  42 min    Activity Tolerance  Patient tolerated treatment well    Behavior During Therapy  [facility] for tasks assessed/performed       Past Medical History:  Diagnosis Date  . ADHD (attention deficit hyperactivity disorder)   . Mood disorder (HCC)     No past surgical history on file.  There were no vitals filed for this visit.  Subjective Assessment - 11/02/19 1325    Subjective  pt states she is having no pain.  Doing her HEP 2X daily.  No issues or questions.  Returns to MD in 6 weeks.    Currently in Pain?  No/denies                       [facility] Adult PT Treatment/Exercise - 11/02/19 0001      Knee/Hip Exercises: Standing   Heel Raises  Both;10 reps    Heel Raises Limitations  one set bilaterally, one set up with both down with Rt only    Forward Step Up  Both;10 reps;Step Height: 4";Hand Hold: 1    Functional Squat  10 reps    SLS  max of 26" Lt, 20: RT    Other Standing Knee Exercises  standing TKE for HEP      Knee/Hip Exercises: Supine   Quad Sets  Right;10 reps    Short Arc Quad Sets  Right;10 reps    Short Arc Quad Sets Limitations  TKE in supine with towel    Heel Slides  10 reps    Straight Leg Raises  10 reps      Knee/Hip Exercises: Sidelying   Hip ABduction  10 reps      Knee/Hip Exercises: Prone   Hamstring Curl  10 reps    Hip Extension  10 reps             PT Education - 11/02/19 1356     Education Details  reviewed goals, HEP and POC moving forward. Added new exercises and given updated HEP    Person(s) Educated  Patient    Methods  Explanation;Demonstration;Tactile cues;Verbal cues;Handout    Comprehension  Returned demonstration;Verbal cues required;Tactile cues required       PT Short Term Goals - 11/02/19 1359      PT SHORT TERM GOAL #1   Title  PT to be I in Hep to improve stability of  Rt knee to decrease pain to no greater than a 4/10.    Time  3    Period  Weeks    Status  On-going    Target Date  11/17/19      PT SHORT TERM GOAL #2   Title  Pt Rt LE strength to be increased  to be able to come from sit to stand without UE assist without  shifting wt to Lt side with ease    Time  3    Period  Weeks    Status  New      PT SHORT TERM GOAL #3   Status  On-going        PT Long Term Goals - 11/02/19 1400      PT LONG TERM GOAL #1   Title  PT Rt Knee pain to be no greater than a 2/10 to allow pt to ambulate for over 30 minutes without increased pain    Time  6    Period  Weeks    Status  On-going      PT LONG TERM GOAL #2   Title  PT Rt knee ROM to be to 115 to be able to sit in a car with comfort for more than 2 hours    Time  6    Period  Weeks    Status  On-going      PT LONG TERM GOAL #3   Title  PT strength in RT knee to be increased to at least a 4/5 to allow pt to be able to ascend and descend 8 steps in a reciprocal manner.    Status  On-going            Plan - 11/02/19 1357    Clinical Impression Statement  Reviewed goals, HEP and POC moving forward.  New exercises added with updated HEP given due to 1X week visits.  Pt completed all exercises with brace onl.  No c/o pain or issues with therex.  Noted weakness with forward step ups.  Pt unble to complete single leg heelraise wihtout discomfort so completed up with both and down with Rt only.    Personal Factors and Comorbidities  Fitness;Time since onset of  injury/illness/exacerbation    Examination-Activity Limitations  Bend;Locomotion Level;Squat;Stairs;Stand;Lift    Examination-Participation Restrictions  Cleaning;Community Activity;Laundry;Yard Work;Shop    Stability/Clinical Decision Making  Stable/Uncomplicated    Rehab Potential  Fair    PT Frequency  1x / week    PT Duration  6 weeks    PT Treatment/Interventions  Patient/family education;Stair training;Functional mobility training;Therapeutic activities;Therapeutic exercise;Balance training;Manual techniques    PT Next Visit Plan  Progess each week giving pt HEP to complete,( pt has no insurance so will only be coming once a week)    PT Home Exercise Plan  heelraises, functional squat, LAQ, Qset, heelside, SLR, sidelying abduction, prone knee flexion (mini as knee becomes unstable, prone hip extension       Patient will benefit from skilled therapeutic intervention in order to improve the following deficits and impairments:  Decreased activity tolerance, Decreased balance, Decreased range of motion, Difficulty walking, Decreased strength, Pain, Obesity  Visit Diagnosis: Stiffness of right knee, not elsewhere classified  Difficulty in walking, not elsewhere classified  Acute pain of right knee     Problem List Patient Active Problem List   Diagnosis Date Noted  . Knee dislocation 07/29/19 07/29/2019   Teena Irani, PTA/CLT [phone]  Teena Irani 11/02/2019, 2:03 PM  [facility] [address] [place], [place], [address] Phone: [phone]   Fax:  [phone]  Name: Mckenzey Parcell MRN: [id number] Date of Birth: 1996-01-16

## 2019-11-09 ENCOUNTER — Other Ambulatory Visit: Payer: Self-pay

## 2019-11-09 ENCOUNTER — Ambulatory Visit (HOSPITAL_COMMUNITY): Payer: Self-pay

## 2019-11-09 ENCOUNTER — Encounter (HOSPITAL_COMMUNITY): Payer: Self-pay

## 2019-11-09 DIAGNOSIS — R262 Difficulty in walking, not elsewhere classified: Secondary | ICD-10-CM

## 2019-11-09 DIAGNOSIS — M25561 Pain in right knee: Secondary | ICD-10-CM

## 2019-11-09 DIAGNOSIS — M25661 Stiffness of right knee, not elsewhere classified: Secondary | ICD-10-CM

## 2019-11-09 NOTE — Patient Instructions (Signed)
  Functional Quadriceps: Sit to Stand    Sit on edge of chair, feet flat on floor. Stand upright, extending knees fully. Repeat 10 times per set. Do 2 sets per session. Do 4 sessions per week.  http://orth.exer.us/735   Copyright  VHI. All rights reserved.  FUNCTIONAL MOBILITY: Squat    Stance: shoulder-width on floor. Bend hips and knees. Keep back straight. Do not allow knees to bend past toes. Squeeze glutes and quads to stand. Complete infront of chair for safe mechanics 10 reps per set, 2 sets per day, 4 days per week  Copyright  VHI. All rights reserved.

## 2019-11-09 NOTE — Therapy (Signed)
[place], [place], [address] Phone: [phone]   Fax:  [phone]  Physical Therapy Treatment  Patient Details  Name: Jill Tran MRN: [id number] Date of Birth: 12-27-1995 Referring Provider (PT): Arther Abbott    Encounter Date: 11/09/2019  PT End of Session - 11/09/19 1409    Visit Number  3    Number of Visits  6    Date for PT Re-Evaluation  12/08/19    Authorization Type  self pay    Progress Note Due on Visit  10    PT Start Time  8016    PT Stop Time  1446    PT Time Calculation (min)  44 min    Activity Tolerance  Patient tolerated treatment well    Behavior During Therapy  [facility] for tasks assessed/performed       Past Medical History:  Diagnosis Date  . ADHD (attention deficit hyperactivity disorder)   . Mood disorder ([place])     History reviewed. No pertinent surgical history.  There were no vitals filed for this visit.  Subjective Assessment - 11/09/19 1407    Subjective  Pt arrived without brace on knee, stated it slides down her leg with current pants on.  Pain scale currently 4/10, throbbing pain.    Diagnostic tests  PVV:ZSMOLMBEML:5. Complete tears of the ACL and PCL2. Complete or near-complete tear of the proximal fibular collateralligament with associated avulsion fracture. The avulsed fragment isnot clearly delineated by MRI.3. Grade 1-2 MCL sprain.4. Intact menisci.  Discoid lateral meniscus.5. Small to moderate knee joint effusion.    Patient Stated Goals  improved stability in her knee    Currently in Pain?  Yes    Pain Score  4          [facility] PT Assessment - 11/09/19 0001      Assessment   Medical Diagnosis  Rt patella dislocation     Referring Provider (PT)  Arther Abbott     Onset Date/Surgical Date  07/29/19    Next MD Visit  12/01/19    Prior Therapy  none      Precautions   Precautions  None                    [facility] Adult PT Treatment/Exercise -  11/09/19 0001      Knee/Hip Exercises: Standing   Heel Raises  Both;10 reps    Heel Raises Limitations  one set bilaterally, one set up with both down with Rt only    Lateral Step Up  Right;10 reps;Hand Hold: 1;Step Height: 4"    Forward Step Up  Both;10 reps;Step Height: 4";Hand Hold: 1    Functional Squat  10 reps      Knee/Hip Exercises: Seated   Sit to Sand  10 reps;without UE support   eccentric control, cueing for equal WB     Knee/Hip Exercises: Supine   Quad Sets  Right;10 reps    Short Arc Quad Sets  10 reps    Heel Slides  10 reps    Heel Slides Limitations  AAROM with Lt LE following AROM    Straight Leg Raises  10 reps;2 sets    Straight Leg Raises Limitations  quad set prior    Knee Flexion  AROM    Knee Flexion Limitations  125      Knee/Hip Exercises: Sidelying   Hip ABduction  10 reps  PT Short Term Goals - 11/02/19 1359      PT SHORT TERM GOAL #1   Title  PT to be I in Hep to improve stability of  Rt knee to decrease pain to no greater than a 4/10.    Time  3    Period  Weeks    Status  On-going    Target Date  11/17/19      PT SHORT TERM GOAL #2   Title  Pt Rt LE strength to be increased  to be able to come from sit to stand without UE assist without shifting wt to Lt side with ease    Time  3    Period  Weeks    Status  New      PT SHORT TERM GOAL #3   Status  On-going        PT Long Term Goals - 11/09/19 1423      PT LONG TERM GOAL #2   Title  PT Rt knee ROM to be to 115 to be able to sit in a car with comfort for more than 2 hours    Baseline  11/09/19: AROM 0-125 degrees    Status  Partially Met      PT LONG TERM GOAL #3   Title  PT strength in RT knee to be increased to at least a 4/5 to allow pt to be able to ascend and descend 8 steps in a reciprocal manner.            Plan - 11/09/19 1409    Clinical Impression Statement  Pt arrived without brace on knee and reports of increased pain.  Pt educated on use  of brace for knee stability and encouraged to wear especially during weight bearing activities.  Session complete wiht open chain exercises until family member able to bring brace towards end of session.  Pt improved AROM 0-125 degrees.  Added STS and progressed heel raises to BLE up and Rt only down wiht HEP.  Began forward and lateral step ups for quad strengthening.  All standing exercises complete wiht brace on.  No increased pain through session, was limited by visible musculature fatigue.    Personal Factors and Comorbidities  Fitness;Time since onset of injury/illness/exacerbation    Examination-Activity Limitations  Bend;Locomotion Level;Squat;Stairs;Stand;Lift    Examination-Participation Restrictions  Cleaning;Community Activity;Laundry;Yard Work;Shop    Stability/Clinical Decision Making  Stable/Uncomplicated    Clinical Decision Making  Low    Rehab Potential  Fair    PT Frequency  1x / week    PT Duration  6 weeks    PT Treatment/Interventions  Patient/family education;Stair training;Functional mobility training;Therapeutic activities;Therapeutic exercise;Balance training;Manual techniques    PT Next Visit Plan  Progess each week giving pt HEP to complete,( pt has no insurance so will only be coming once a week)    PT Home Exercise Plan  heelraises, functional squat, LAQ, Qset, heelside, SLR, sidelying abduction, prone knee flexion (mini as knee becomes unstable, prone hip extension: TKE; STS       Patient will benefit from skilled therapeutic intervention in order to improve the following deficits and impairments:  Decreased activity tolerance, Decreased balance, Decreased range of motion, Difficulty walking, Decreased strength, Pain, Obesity  Visit Diagnosis: Stiffness of right knee, not elsewhere classified  Difficulty in walking, not elsewhere classified  Acute pain of right knee     Problem List Patient Active Problem List   Diagnosis Date Noted  . Knee dislocation  07/29/19 07/29/2019   Ihor Austin, LPTA/CLT; CBIS [phone]  Aldona Lento 11/09/2019, 5:36 PM  [facility] [address] [place], [place], [address] Phone: [phone]   Fax:  [phone]  Name: Jill Tran MRN: [id number] Date of Birth: 08-19-1995

## 2019-11-16 ENCOUNTER — Ambulatory Visit (HOSPITAL_COMMUNITY): Payer: Self-pay | Admitting: Physical Therapy

## 2019-11-23 ENCOUNTER — Ambulatory Visit (HOSPITAL_COMMUNITY): Payer: Self-pay | Admitting: Physical Therapy

## 2019-11-23 ENCOUNTER — Other Ambulatory Visit: Payer: Self-pay

## 2019-11-23 DIAGNOSIS — M25661 Stiffness of right knee, not elsewhere classified: Secondary | ICD-10-CM

## 2019-11-23 DIAGNOSIS — M25561 Pain in right knee: Secondary | ICD-10-CM

## 2019-11-23 DIAGNOSIS — R262 Difficulty in walking, not elsewhere classified: Secondary | ICD-10-CM

## 2019-11-23 NOTE — Therapy (Signed)
[place], [place], [address] Phone: [phone]   Fax:  [phone]  Physical Therapy Treatment  Patient Details  Name: Jill Tran MRN: [id number] Date of Birth: 1996/02/18 Referring Provider (PT): Arther Abbott    Encounter Date: 11/23/2019  PT End of Session - 11/23/19 1432    Visit Number  4    Number of Visits  6    Date for PT Re-Evaluation  12/08/19    Authorization Type  self pay    Progress Note Due on Visit  6    PT Start Time  1400    PT Stop Time  1430    PT Time Calculation (min)  30 min    Activity Tolerance  Patient tolerated treatment well    Behavior During Therapy  [facility] for tasks assessed/performed       Past Medical History:  Diagnosis Date  . ADHD (attention deficit hyperactivity disorder)   . Mood disorder ([place])     No past surgical history on file.  There were no vitals filed for this visit.  Subjective Assessment - 11/23/19 1402    Subjective  Pt arrived without brace on knee, stated it slides down her leg with current pants on.  Pain scale currently 4/10, throbbing pain.    Limitations  Walking;Standing;Sitting;Reading;House hold activities    How long can you sit comfortably?  an hour    How long can you stand comfortably?  with the brace on she can stand for prolong time.    How long can you walk comfortably?  with my brace on I'm good    Diagnostic tests  IRJ:JOACZYSAYT:0. Complete tears of the ACL and PCL2. Complete or near-complete tear of the proximal fibular collateralligament with associated avulsion fracture. The avulsed fragment isnot clearly delineated by MRI.3. Grade 1-2 MCL sprain.4. Intact menisci.  Discoid lateral meniscus.5. Small to moderate knee joint effusion.    Patient Stated Goals  improved stability in her knee    Currently in Pain?  No/denies                [facility] Adult PT Treatment/Exercise - 11/23/19 0001      Exercises   Exercises   Knee/Hip      Knee/Hip Exercises: Standing   Heel Raises  Both;10 reps    Heel Raises Limitations  one set bilaterally, one set up with both down with Rt only    Knee Flexion  Strengthening;Right;10 reps    Knee Flexion Limitations  3#    Forward Lunges  Right;10 reps    Forward Lunges Limitations  onto 4" step     Lateral Step Up  Right;10 reps;Hand Hold: 1;Step Height: 4"    Forward Step Up  Both;10 reps;Step Height: 4";Hand Hold: 1    Functional Squat  10 reps    SLS  x3      Knee/Hip Exercises: Seated   Sit to Sand  10 reps;without UE support   eccentric control, cueing for equal WB     Knee/Hip Exercises: Supine   Quad Sets  --    Short Arc Quad Sets  --    Heel Slides  --    Heel Slides Limitations  --    Straight Leg Raises  --    Straight Leg Raises Limitations  --    Knee Flexion  AROM    Knee Flexion Limitations  125      Knee/Hip Exercises: Sidelying   Hip  ABduction  --             PT Education - 11/23/19 1431    Education Details  updated HEP    Person(s) Educated  Patient    Methods  Explanation    Comprehension  Verbalized understanding       PT Short Term Goals - 11/02/19 1359      PT SHORT TERM GOAL #1   Title  PT to be I in Hep to improve stability of  Rt knee to decrease pain to no greater than a 4/10.    Time  3    Period  Weeks    Status  On-going    Target Date  11/17/19      PT SHORT TERM GOAL #2   Title  Pt Rt LE strength to be increased  to be able to come from sit to stand without UE assist without shifting wt to Lt side with ease    Time  3    Period  Weeks    Status  New      PT SHORT TERM GOAL #3   Status  On-going        PT Long Term Goals - 11/09/19 1423      PT LONG TERM GOAL #2   Title  PT Rt knee ROM to be to 115 to be able to sit in a car with comfort for more than 2 hours    Baseline  11/09/19: AROM 0-125 degrees    Status  Partially Met      PT LONG TERM GOAL #3   Title  PT strength in RT knee to be  increased to at least a 4/5 to allow pt to be able to ascend and descend 8 steps in a reciprocal manner.            Plan - 11/23/19 1432    Clinical Impression Statement  Pt has braced donned but states that it keeps slipping down.  Pt to see Dr. Aline Brochure.  PT shows sign of fatigue with exercises, ie muscles quivering but otherwise completes exercises in good form.    Personal Factors and Comorbidities  Fitness;Time since onset of injury/illness/exacerbation    Examination-Activity Limitations  Bend;Locomotion Level;Squat;Stairs;Stand;Lift    Examination-Participation Restrictions  Cleaning;Community Activity;Laundry;Yard Work;Shop    Stability/Clinical Decision Making  Stable/Uncomplicated    Rehab Potential  Fair    PT Frequency  1x / week    PT Duration  6 weeks    PT Treatment/Interventions  Patient/family education;Stair training;Functional mobility training;Therapeutic activities;Therapeutic exercise;Balance training;Manual techniques    PT Next Visit Plan  Reassess for MD visit.  Progess each week giving pt HEP to complete,( pt has no insurance so will only be coming once a week)    PT Home Exercise Plan  heelraises, functional squat, LAQ, Qset, heelside, SLR, sidelying abduction, prone knee flexion (mini as knee becomes unstable, prone hip extension: TKE; STS; 11/23/2019:  standing knee flexion, single heelraise, step up, step down and lunges       Patient will benefit from skilled therapeutic intervention in order to improve the following deficits and impairments:  Decreased activity tolerance, Decreased balance, Decreased range of motion, Difficulty walking, Decreased strength, Pain, Obesity  Visit Diagnosis: Stiffness of right knee, not elsewhere classified  Difficulty in walking, not elsewhere classified  Acute pain of right knee     Problem List Patient Active Problem List   Diagnosis Date Noted  . Knee dislocation 07/29/19 07/29/2019  Rayetta Humphrey, PT  CLT [phone] 11/23/2019, 2:39 PM  [facility] [address] [place], [place], [address] Phone: [phone]   Fax:  [phone]  Name: [name] MRN: [id number] Date of Birth: [birth year]

## 2019-11-30 ENCOUNTER — Ambulatory Visit: Payer: Self-pay | Admitting: Orthopedic Surgery

## 2019-11-30 ENCOUNTER — Ambulatory Visit (HOSPITAL_COMMUNITY): Payer: Self-pay | Attending: Orthopedic Surgery | Admitting: Physical Therapy

## 2019-11-30 DIAGNOSIS — R262 Difficulty in walking, not elsewhere classified: Secondary | ICD-10-CM | POA: Insufficient documentation

## 2019-11-30 DIAGNOSIS — M25661 Stiffness of right knee, not elsewhere classified: Secondary | ICD-10-CM | POA: Insufficient documentation

## 2019-11-30 DIAGNOSIS — M25561 Pain in right knee: Secondary | ICD-10-CM | POA: Insufficient documentation

## 2019-12-01 ENCOUNTER — Encounter: Payer: Self-pay | Admitting: Orthopedic Surgery

## 2019-12-01 ENCOUNTER — Other Ambulatory Visit: Payer: Self-pay

## 2019-12-01 ENCOUNTER — Ambulatory Visit (INDEPENDENT_AMBULATORY_CARE_PROVIDER_SITE_OTHER): Payer: Self-pay | Admitting: Orthopedic Surgery

## 2019-12-01 VITALS — BP 129/91 | HR 103 | Ht 62.0 in | Wt 222.0 lb

## 2019-12-01 DIAGNOSIS — S83104D Unspecified dislocation of right knee, subsequent encounter: Secondary | ICD-10-CM

## 2019-12-01 DIAGNOSIS — Z6841 Body Mass Index (BMI) 40.0 and over, adult: Secondary | ICD-10-CM

## 2019-12-01 NOTE — Progress Notes (Signed)
Chief Complaint  Patient presents with  . Knee Pain    R/feeling good   24 year old female status post low energy dislocation right knee treated with closed reduction bracing now in physical therapy x6 weeks it started on April 29  Her collateral ligaments feel stable today her range of motion is improved to near normal.  She has a posterior sag sign with a firm endpoint on the PCL grade 2 and a Lachman grade 1  Recommend continue bracing and physical therapy follow-up with me in 6 weeks  Encounter Diagnoses  Name Primary?  . Body mass index 40.0-44.9, adult (HCC) Yes  . Morbid obesity (HCC)   . Dislocation of right knee, subsequent encounter

## 2019-12-07 ENCOUNTER — Ambulatory Visit (HOSPITAL_COMMUNITY): Payer: Self-pay | Admitting: Physical Therapy

## 2019-12-07 ENCOUNTER — Telehealth (HOSPITAL_COMMUNITY): Payer: Self-pay | Admitting: Physical Therapy

## 2019-12-07 NOTE — Telephone Encounter (Signed)
Called PT re 1st no show:  PT cancelled last appointment due to car problems and stated that she was told that her next appointment was on the 14th.  Explained to the patient that she does not have an appointment on the 14th but rather her appointment was on the 16th.  PT stated the MD wanted her to have 6 more weeks of therapy, the therapist explained that we will reassess and decide how much longer she will benefit from.    Virgina Organ, PT CLT [phone]

## 2019-12-14 ENCOUNTER — Encounter (HOSPITAL_COMMUNITY): Payer: Self-pay | Admitting: Physical Therapy

## 2019-12-14 ENCOUNTER — Ambulatory Visit (HOSPITAL_COMMUNITY): Payer: Self-pay | Admitting: Physical Therapy

## 2019-12-14 ENCOUNTER — Other Ambulatory Visit: Payer: Self-pay

## 2019-12-14 DIAGNOSIS — M25661 Stiffness of right knee, not elsewhere classified: Secondary | ICD-10-CM

## 2019-12-14 DIAGNOSIS — R262 Difficulty in walking, not elsewhere classified: Secondary | ICD-10-CM

## 2019-12-14 DIAGNOSIS — M25561 Pain in right knee: Secondary | ICD-10-CM

## 2019-12-14 NOTE — Therapy (Signed)
[facility] [address] [place], [place], [address] Phone: [phone]   Fax:  [phone]  Physical Therapy Treatment and Discharge Note  Patient Details  Name: Jill Tran MRN: [id number] Date of Birth: Dec 23, 1995 Referring Provider (PT): Arther Abbott   PHYSICAL THERAPY DISCHARGE SUMMARY  Visits from Start of Care: 5  Current functional level related to goals / functional outcomes: All goals but one short term goal met   Remaining deficits: None    Education / Equipment: On HEP and progress made  Plan: Patient agrees to discharge.  Patient goals were partially met. Patient is being discharged due to being pleased with the current functional level.  ?????         Encounter Date: 12/14/2019   PT End of Session - 12/14/19 1546    Visit Number 5    Number of Visits 6    Date for PT Re-Evaluation 12/08/19    Authorization Type self pay    Progress Note Due on Visit 6    PT Start Time 1546   pt late to session   PT Stop Time 1610    PT Time Calculation (min) 24 min    Activity Tolerance Patient tolerated treatment well    Behavior During Therapy [facility] for tasks assessed/performed           Past Medical History:  Diagnosis Date  . ADHD (attention deficit hyperactivity disorder)   . Mood disorder ([place])     History reviewed. No pertinent surgical history.  There were no vitals filed for this visit.       [facility] PT Assessment - 12/14/19 0001      Assessment   Medical Diagnosis Rt patella dislocation     Referring Provider (PT) Arther Abbott     Onset Date/Surgical Date 07/29/19      Observation/Other Assessments   Focus on Therapeutic Outcomes (FOTO)  77% function   was 53% limited     AROM   AROM Assessment Site Knee    Right/Left Knee Right    Right Knee Extension 0    Right Knee Flexion 130      Strength   Right Hip Flexion 4+/5    Right Hip Extension 4+/5    Right Hip ABduction 4/5     Right Knee Extension 4+/5    Right Ankle Dorsiflexion 4+/5      Ambulation/Gait   Stairs Yes    Stairs Assistance 7: Independent    Stair Management Technique No rails;Alternating pattern    Number of Stairs 8    Height of Stairs 7    Gait Comments no pain with stairs                                 PT Education - 12/14/19 1606    Education Details educated patient on standing and walking program to mimic work environment, reviewed HEP, FOTO score and answered all questions.    Person(s) Educated Patient    Methods Explanation    Comprehension Verbalized understanding            PT Short Term Goals - 12/14/19 1555      PT SHORT TERM GOAL #1   Title PT to be I in Hep to improve stability of  Rt knee to decrease pain to no greater than a 4/10.    Baseline 5/10 pain with rain but otherwise no pain  Time 3    Period Weeks    Status Partially Met    Target Date 11/17/19      PT SHORT TERM GOAL #2   Title Pt Rt LE strength to be increased  to be able to come from sit to stand without UE assist without shifting wt to Lt side with ease    Time 3    Period Weeks    Status Achieved      PT SHORT TERM GOAL #3   Status On-going             PT Long Term Goals - 12/14/19 1556      PT LONG TERM GOAL #1   Title PT Rt Knee pain to be no greater than a 2/10 to allow pt to ambulate for over 30 minutes without increased pain    Baseline can stand for 30 minutes but feels weak    Status Achieved      PT LONG TERM GOAL #2   Title PT Rt knee ROM to be to 115 to be able to sit in a car with comfort for more than 2 hours    Baseline able to sit in car for 2 hours and has 125    Status Achieved      PT LONG TERM GOAL #3   Title PT strength in RT knee to be increased to at least a 4/5 to allow pt to be able to ascend and descend 8 steps in a reciprocal manner.    Baseline able to go up and down stairs with alternating pattern. improved strength 4/5     Status Achieved                 Plan - 12/14/19 1607    Clinical Impression Statement Patient present for reassessment on this date. Has met all long term goals and 1 of 2 short term goals. Pain still present with rain/bad weather. Reviewed exercises with patient and discussed progress. Patient has no functional concerns or limitations at this time. Patient is confident in home exercises and would like to be discharged at this time. Answered all questions prior to discharge.    Personal Factors and Comorbidities Fitness;Time since onset of injury/illness/exacerbation    Examination-Activity Limitations Bend;Locomotion Level;Squat;Stairs;Stand;Lift    Examination-Participation Restrictions Cleaning;Community Activity;Laundry;Yard Work;Shop    Stability/Clinical Decision Making Stable/Uncomplicated    Rehab Potential Fair    PT Frequency 1x / week    PT Duration 6 weeks    PT Treatment/Interventions Patient/family education;Stair training;Functional mobility training;Therapeutic activities;Therapeutic exercise;Balance training;Manual techniques    PT Next Visit Plan patient to discharge from PT to HEP at this time.    PT Home Exercise Plan heelraises, functional squat, LAQ, Qset, heelside, SLR, sidelying abduction, prone knee flexion (mini as knee becomes unstable, prone hip extension: TKE; STS; 11/23/2019:  standing knee flexion, single heelraise, step up, step down and lunges    Consulted and Agree with Plan of Care Patient           Patient will benefit from skilled therapeutic intervention in order to improve the following deficits and impairments:  Decreased activity tolerance, Decreased balance, Decreased range of motion, Difficulty walking, Decreased strength, Pain, Obesity  Visit Diagnosis: Stiffness of right knee, not elsewhere classified  Difficulty in walking, not elsewhere classified  Acute pain of right knee     Problem List Patient Active Problem List   Diagnosis  Date Noted  . Knee dislocation 07/29/19 07/29/2019  4:13 PM, 12/14/19 Jerene Pitch, DPT Physical Therapy with [facility]  [phone] office  [facility] [address] [place], [place], [address] Phone: [phone]   Fax:  [phone]  Name: Armanda Forand MRN: [id number] Date of Birth: August 12, 1995

## 2020-01-12 ENCOUNTER — Ambulatory Visit (INDEPENDENT_AMBULATORY_CARE_PROVIDER_SITE_OTHER): Payer: Self-pay | Admitting: Orthopedic Surgery

## 2020-01-12 ENCOUNTER — Other Ambulatory Visit: Payer: Self-pay

## 2020-01-12 ENCOUNTER — Encounter: Payer: Self-pay | Admitting: Orthopedic Surgery

## 2020-01-12 VITALS — BP 133/81 | HR 112 | Ht 62.0 in | Wt 215.0 lb

## 2020-01-12 DIAGNOSIS — M5416 Radiculopathy, lumbar region: Secondary | ICD-10-CM

## 2020-01-12 DIAGNOSIS — S83104D Unspecified dislocation of right knee, subsequent encounter: Secondary | ICD-10-CM

## 2020-01-12 DIAGNOSIS — Z6841 Body Mass Index (BMI) 40.0 and over, adult: Secondary | ICD-10-CM

## 2020-01-12 DIAGNOSIS — M5441 Lumbago with sciatica, right side: Secondary | ICD-10-CM

## 2020-01-12 MED ORDER — GABAPENTIN 300 MG PO CAPS: 300.0000 mg | ORAL_CAPSULE | Freq: Three times a day (TID) | ORAL | 5 refills | Status: AC

## 2020-01-12 MED ORDER — IBUPROFEN 800 MG PO TABS: 800.0000 mg | ORAL_TABLET | Freq: Three times a day (TID) | ORAL | 0 refills | Status: AC | PRN

## 2020-01-12 NOTE — Progress Notes (Signed)
   Jill Tran  01/12/2020  Body mass index is 39.32 kg/m.   S: 24 year old female with low energy dislocation right knee treated with closed reduction bracing now physical therapy which actually she just completed  Therapy started April 29  Date of injury July 29, 2019  Patient is also in a hinged knee brace she is doing well with her knee  She does complain of 1 week history of lower back pain numbness and tingling right leg and foot with some swelling  She is no longer on any medication she was on ibuprofen   O: BP 133/81   Pulse (!) 112   Ht 5\' 2"  (1.575 m)   Wt 215 lb (97.5 kg)   BMI 39.32 kg/m   Physical Exam  Examination right knee 2+ firm posterior drawer with mild sag Lachman test is trace to 1+ positive collateral ligaments are stable  She has swelling of the right foot and she has decreased sensation in the L5 and L4 distribution with tenderness in the lower back  Reflexes are normal pulse and perfusion are normal   MEDICAL DECISION MAKING  Encounter Diagnoses  Name Primary?  . Morbid obesity (HCC)   . Body mass index 40.0-44.9, adult (HCC)   . Dislocation of right knee, subsequent encounter   . Radiculopathy, lumbar region Yes  . Acute midline low back pain with right-sided sciatica     Outside records reviewed:   MANAGEMENT   Unlikely that this is related to the knee dislocation as she has no pain and had good neurovascular function throughout has not had any numbness or tingling to a week ago and this is associated with new onset back pain and radiating pain to the right leg  Recommend following medications for 4 weeks if she does not improve when she comes back we can do imaging of the lumbar spine  Meds ordered this encounter  Medications  . gabapentin (NEURONTIN) 300 MG capsule    Sig: Take 1 capsule (300 mg total) by mouth 3 (three) times daily.    Dispense:  90 capsule    Refill:  5  . ibuprofen (ADVIL) 800 MG tablet    Sig: Take  1 tablet (800 mg total) by mouth every 8 (eight) hours as needed.    Dispense:  90 tablet    Refill:  0      04-[birth year]1982, MD  01/12/2020 3:18 PM

## 2020-01-12 NOTE — Addendum Note (Signed)
Addended by: Vickki Hearing on: 01/12/2020 04:17 PM   Modules accepted: Level of Service

## 2020-02-13 ENCOUNTER — Encounter: Payer: Self-pay | Admitting: Orthopedic Surgery

## 2020-02-13 ENCOUNTER — Ambulatory Visit (INDEPENDENT_AMBULATORY_CARE_PROVIDER_SITE_OTHER): Payer: Self-pay | Admitting: Orthopedic Surgery

## 2020-02-13 ENCOUNTER — Other Ambulatory Visit: Payer: Self-pay

## 2020-02-13 VITALS — BP 153/118 | HR 82 | Ht 62.0 in | Wt 219.0 lb

## 2020-02-13 DIAGNOSIS — Z6841 Body Mass Index (BMI) 40.0 and over, adult: Secondary | ICD-10-CM

## 2020-02-13 DIAGNOSIS — S83104D Unspecified dislocation of right knee, subsequent encounter: Secondary | ICD-10-CM

## 2020-02-13 DIAGNOSIS — M5416 Radiculopathy, lumbar region: Secondary | ICD-10-CM

## 2020-02-13 NOTE — Patient Instructions (Signed)
You can stop the knee brace  Use Gabapentin if you have tingling  Wear stockings

## 2020-02-13 NOTE — Progress Notes (Signed)
Chief Complaint  Patient presents with  . Knee Pain    right knee, patient reports the gabapentin will help some and when the gabapentin wears off after 2 hours.    Encounter Diagnoses  Name Primary?  . Morbid obesity (HCC)   . Body mass index 40.0-44.9, adult (HCC)   . Dislocation of right knee, subsequent encounter Yes  . Radiculopathy, lumbar region    BP (!) 153/118   Pulse 82   Ht 5\' 2"  (1.575 m)   Wt 219 lb (99.3 kg)   BMI 40.06 kg/m   Jill Tran has improved significantly in terms of the tingling in her right leg on the gabapentin  She does complain of intermittent swelling  She still wearing her knee brace he says her knee feels good  Examination reveals no swelling in the knee or the leg I think she is getting some swelling beneath the brace which were again removed today  The knee feels stable with some trace positive laxity in the PCL.  Collateral ligaments stable ACL stable  Range of motion has returned to normal  Recommend compression stockings  Follow-up 1 month

## 2020-03-12 ENCOUNTER — Encounter: Payer: Self-pay | Admitting: Orthopedic Surgery

## 2020-03-12 ENCOUNTER — Ambulatory Visit: Payer: Self-pay | Admitting: Orthopedic Surgery

## 2020-05-30 ENCOUNTER — Ambulatory Visit: Payer: Self-pay | Admitting: Orthopedic Surgery

## 2021-06-14 IMAGING — DX DG KNEE 1-2V*R*
2 series · 2 of 2 positions shown · non-contrast
Comparison: Radiograph earlier this day.

CLINICAL DATA: Dislocation, postreduction.

EXAM:
RIGHT KNEE - 1-2 VIEW

[knee ap]
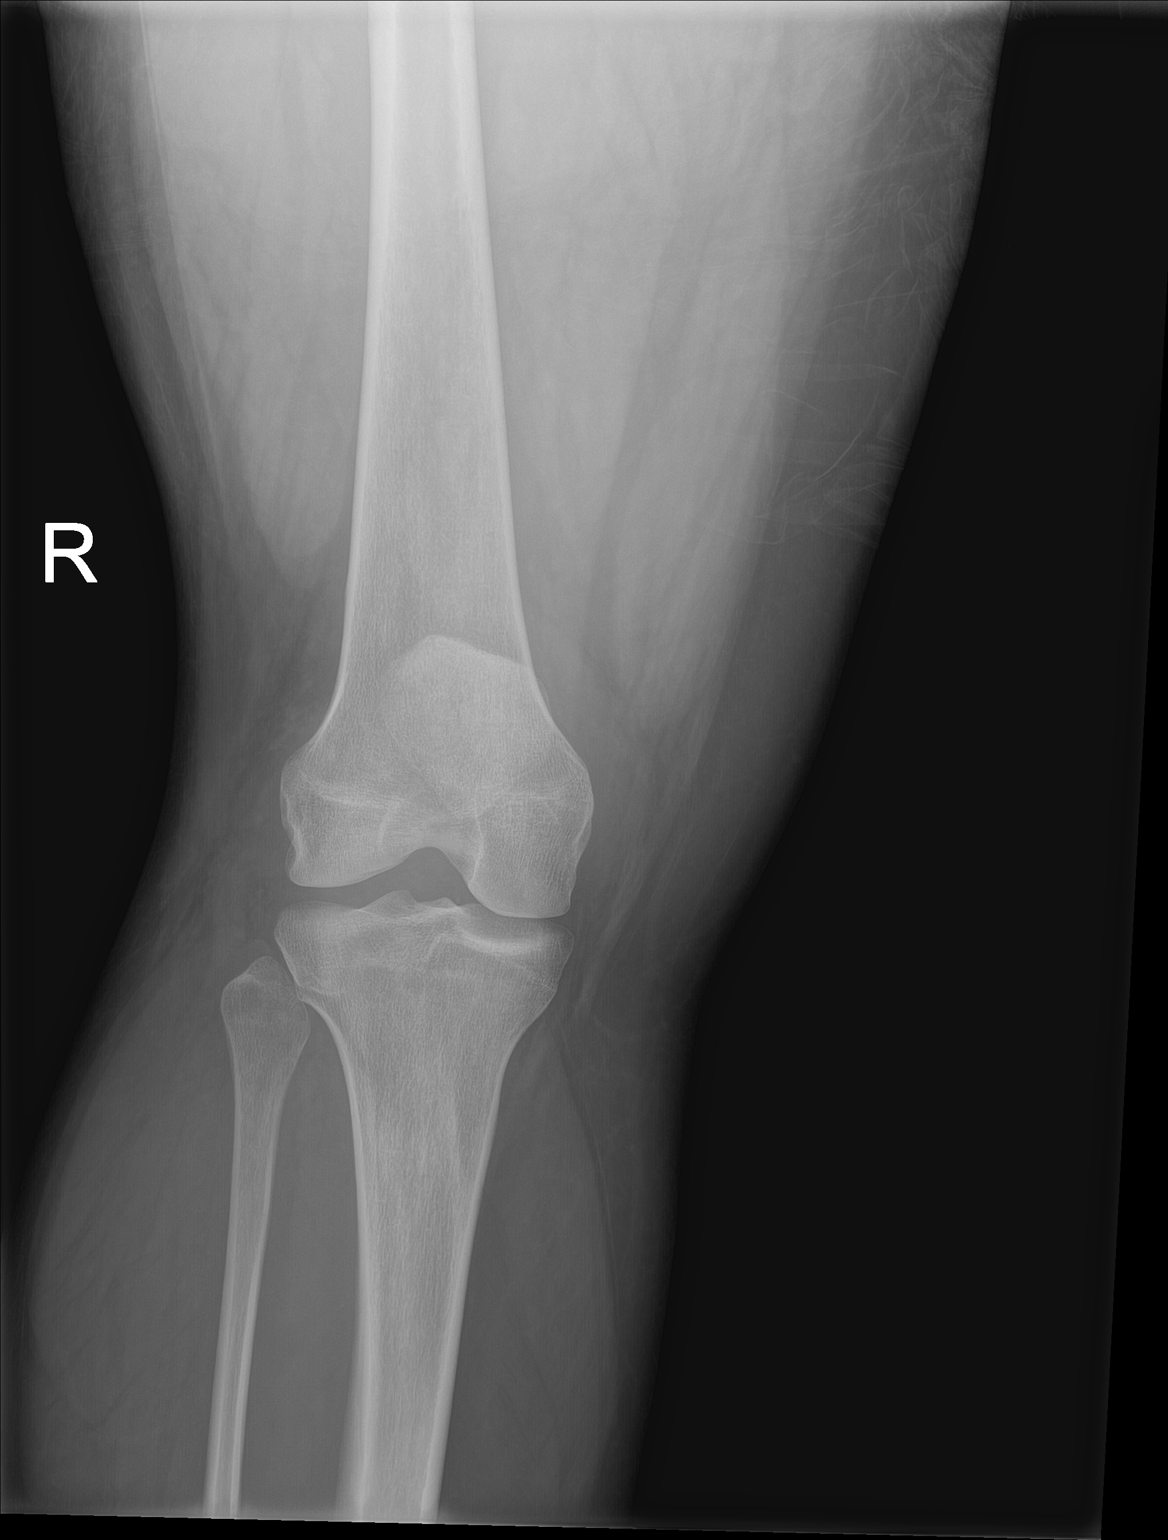

[knee lat]
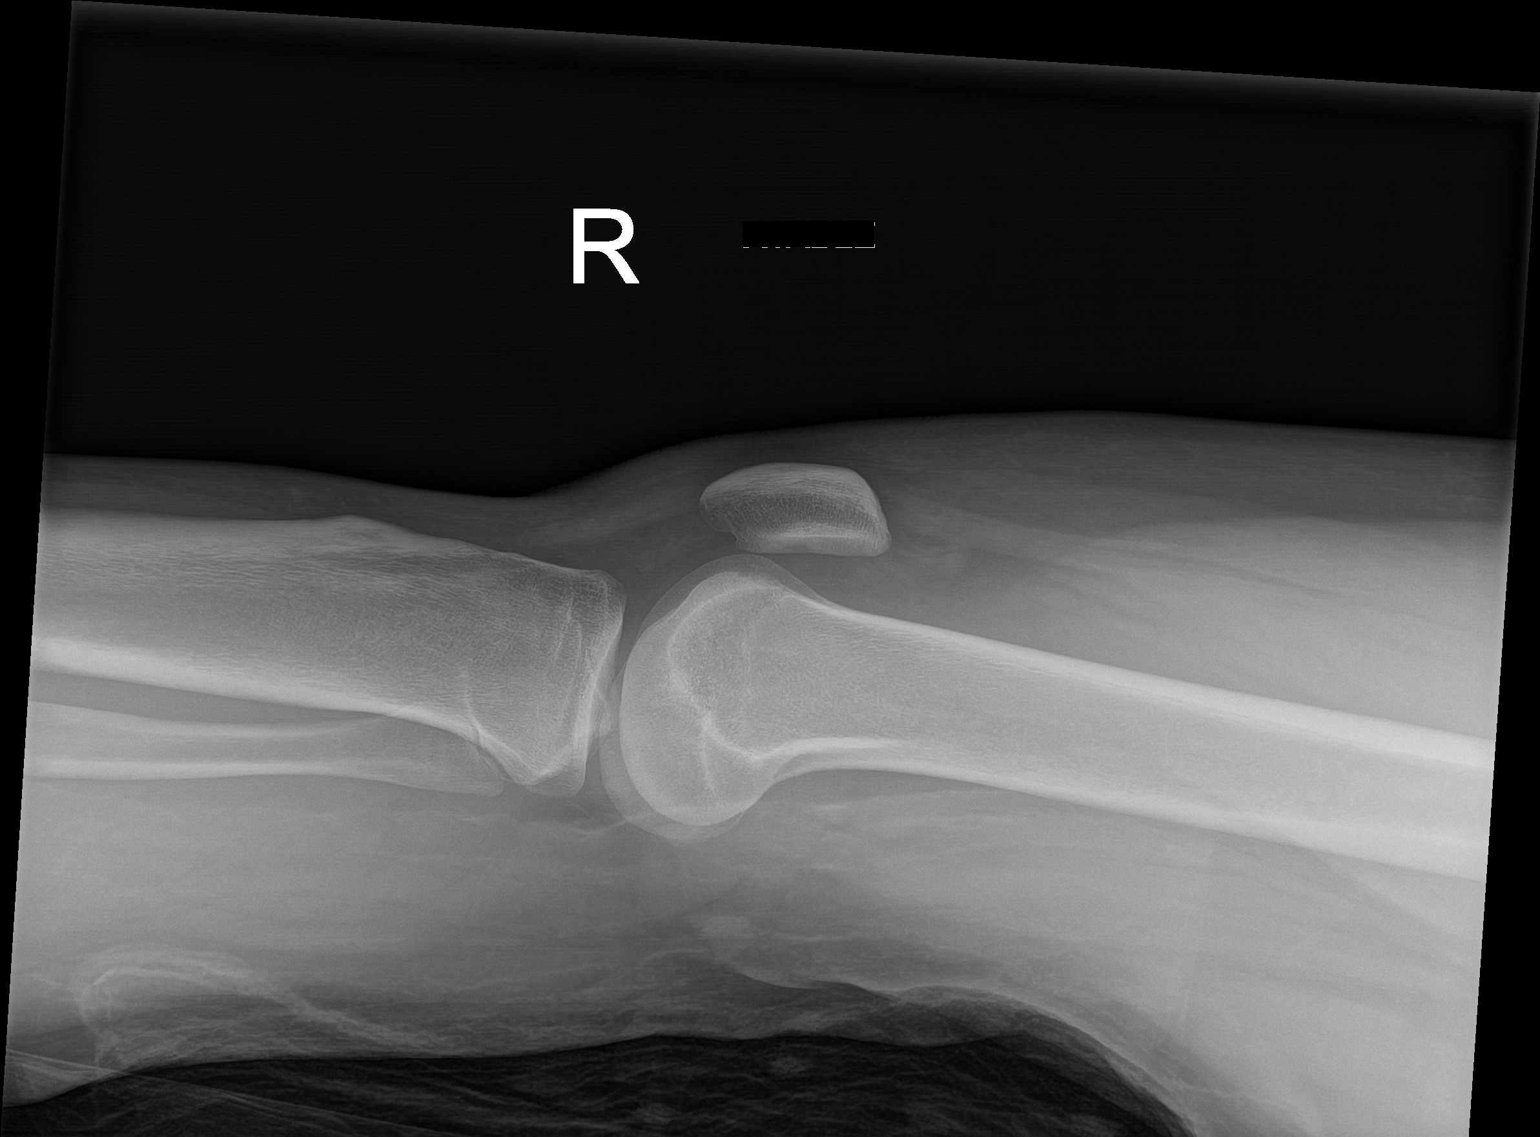

[2 of 2 positions shown; findings below may reference images not displayed]

FINDINGS: Previous posterior knee dislocation has been reduced. The alignment
is currently anatomic. Previous patellar B[place] is no longer seen. No
visualized fracture. Small knee joint effusion. Generalized soft
tissue edema.
IMPRESSION: Reduction of posterior knee dislocation.  No visualized fracture.

## 2021-06-14 IMAGING — DX DG KNEE 1-2V*R*
2 series · 2 of 2 positions shown · non-contrast
Comparison: None.

CLINICAL DATA: Slip on ice at [place]. Fall with right lower leg
deformity.

EXAM:
RIGHT KNEE - 1-2 VIEW

[knee ap]
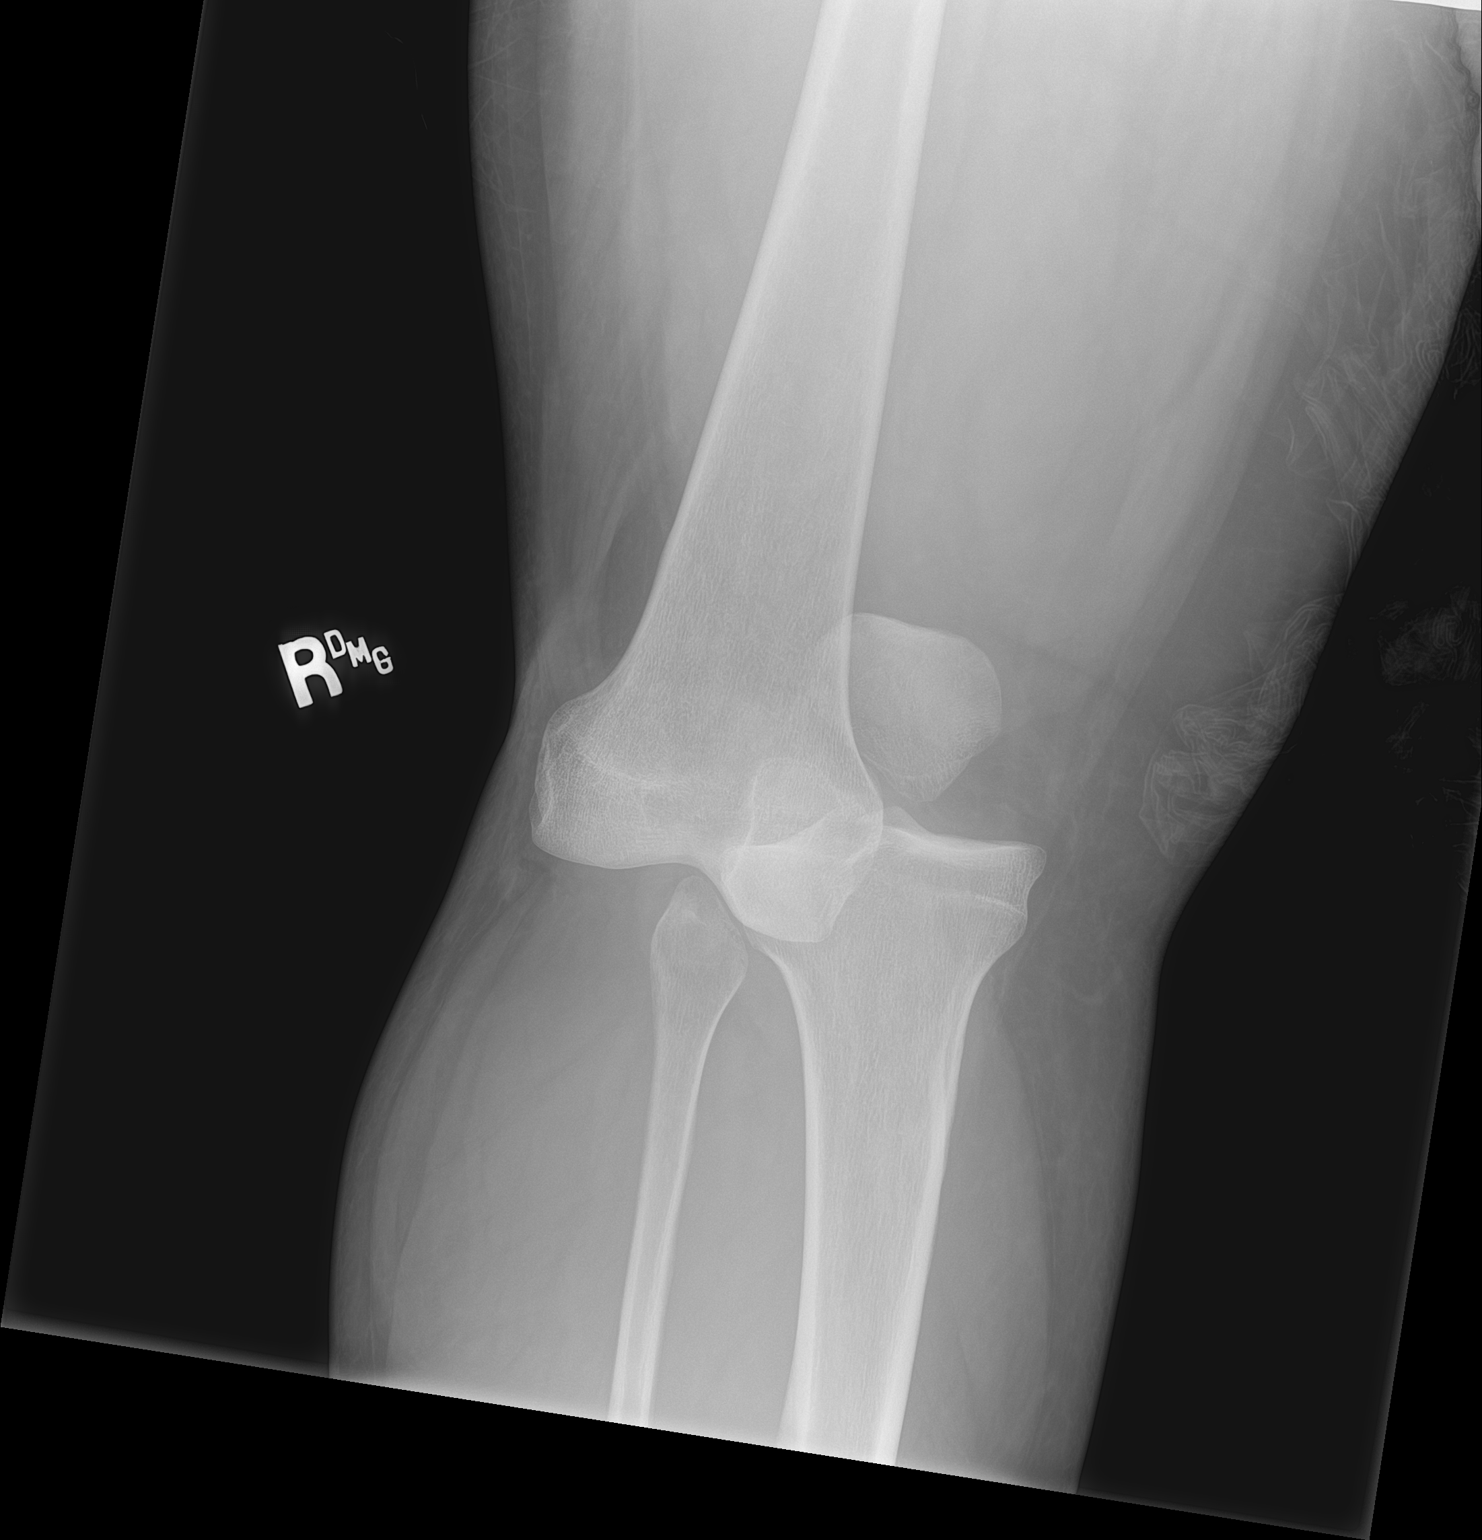

[knee lat]
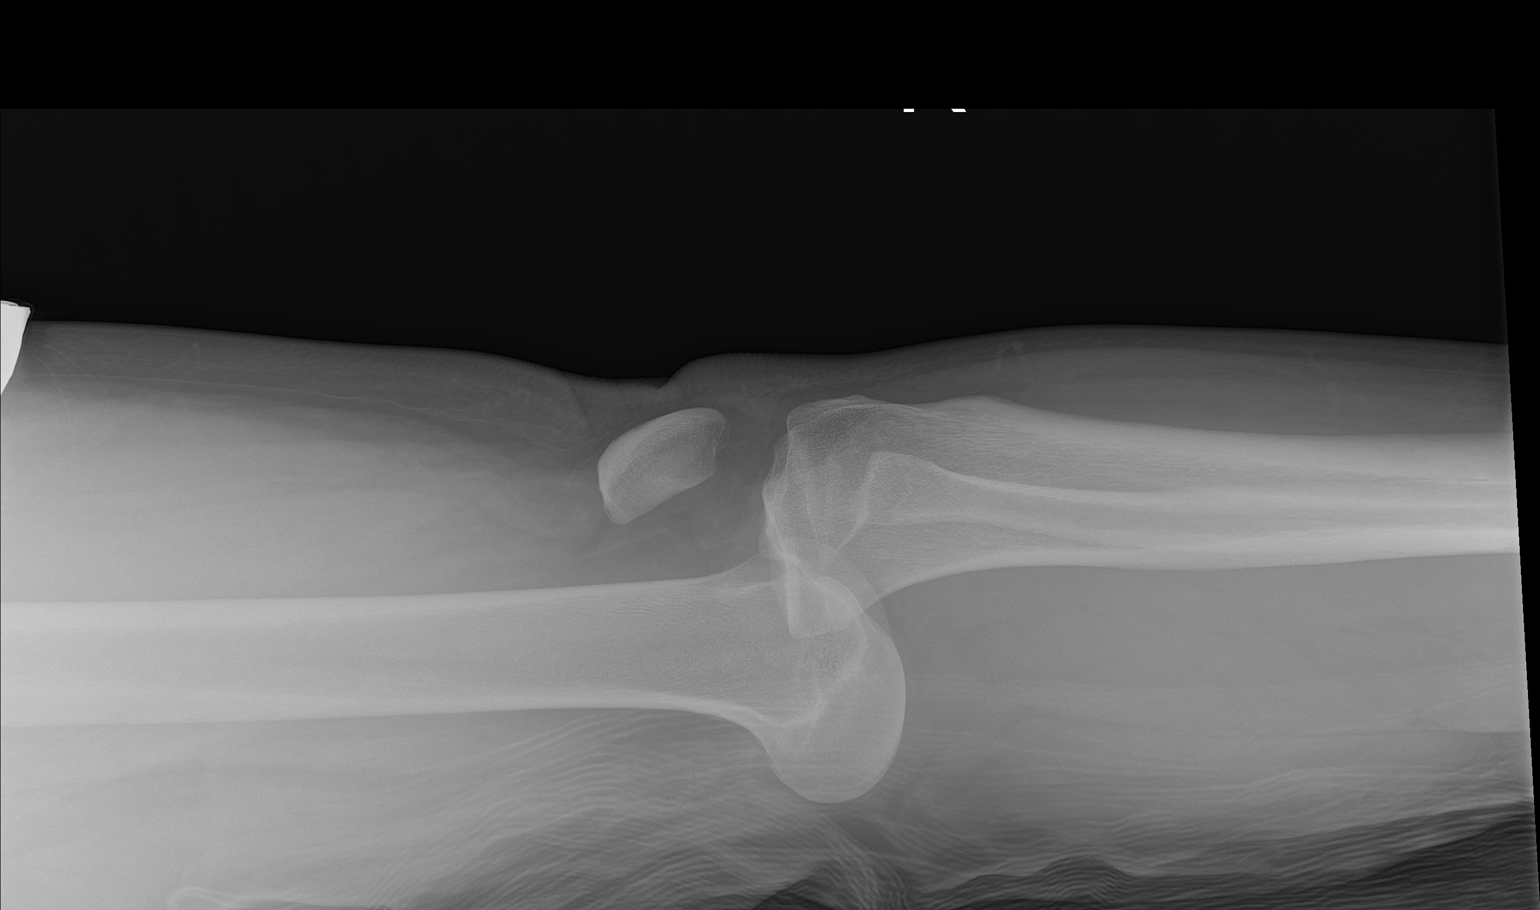

[2 of 2 positions shown; findings below may reference images not displayed]

FINDINGS: Posterior and lateral dislocation of the femur with respect to the
tibia. Patellar B[place], patella otherwise remains normally aligned. No
evidence of acute fracture.
IMPRESSION: Posterior and lateral dislocation of the femur with respect to the
tibia. No evidence of acute fracture. Recommend correlation with
lower extremity pulse exam given increased incidence of vascular
injury with knee dislocations.

## 2021-06-14 IMAGING — DX DG TIBIA/FIBULA 2V*R*
2 series · 2 of 2 positions shown · non-contrast
Comparison: None.

CLINICAL DATA: Slip on ice at [place]. Fall with right lower leg
deformity.

EXAM:
RIGHT TIBIA AND FIBULA - 2 VIEW

[tibia ap]
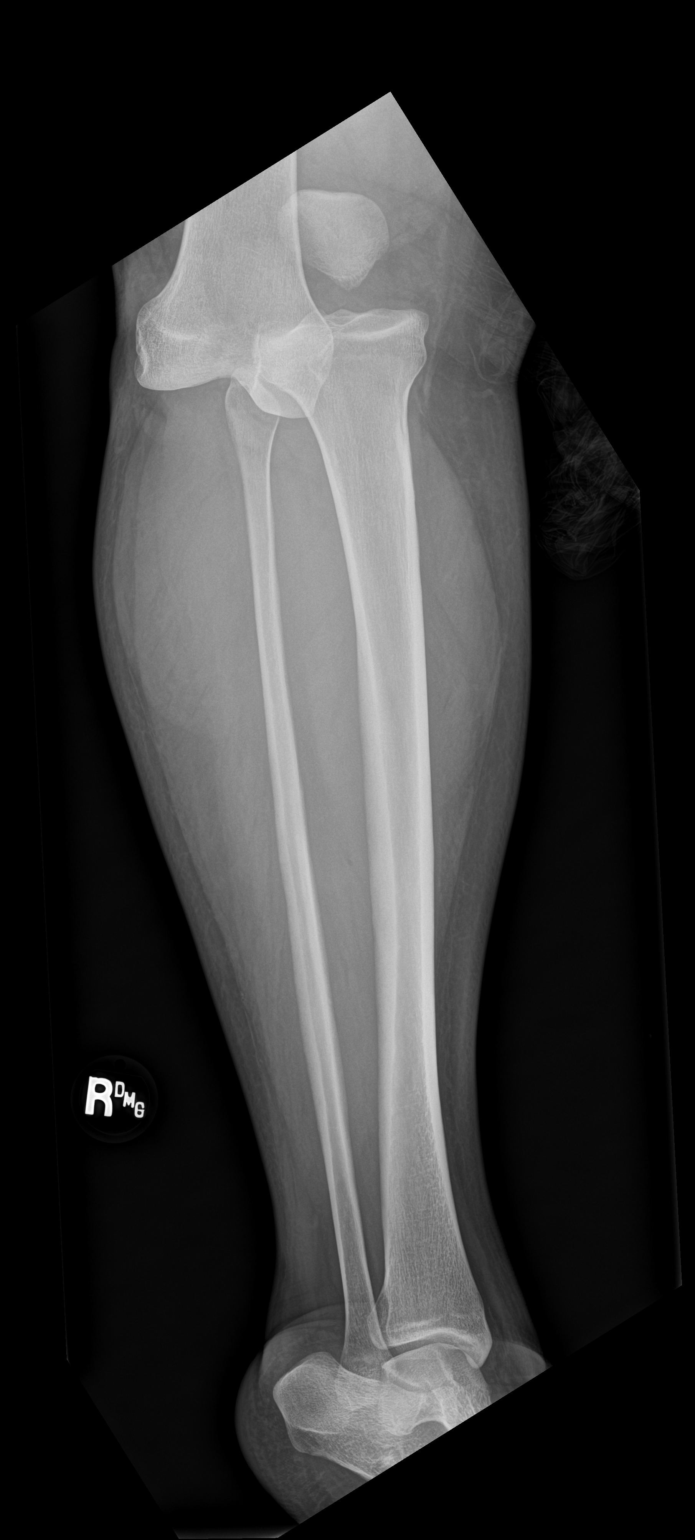

[tibia lat]
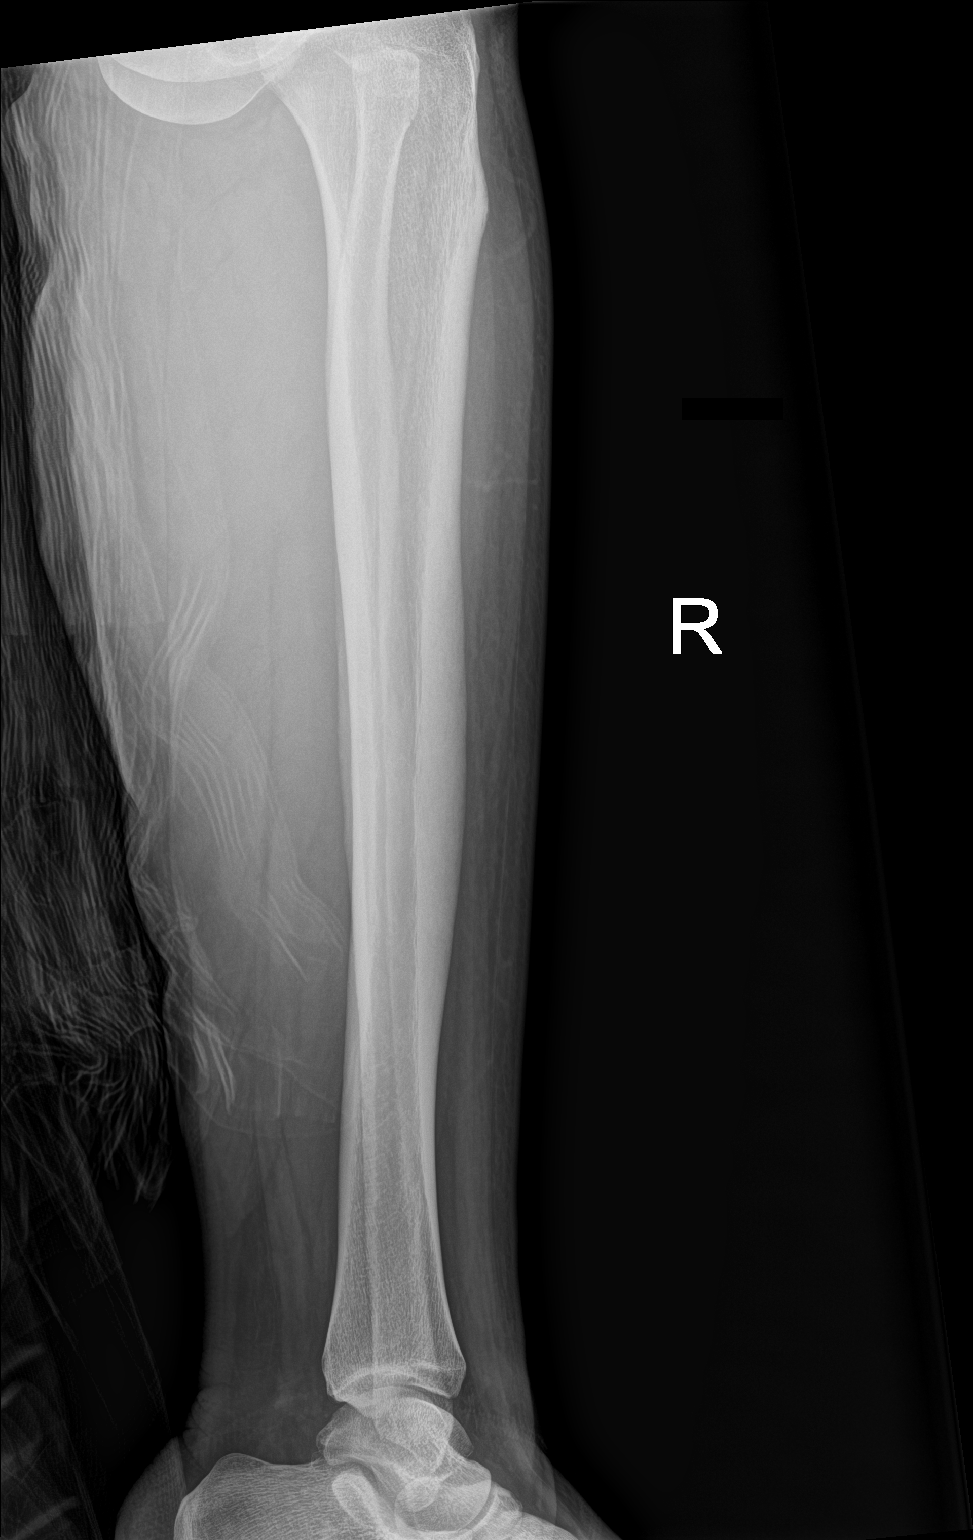

[2 of 2 positions shown; findings below may reference images not displayed]

FINDINGS: Knee dislocation better assessed on concurrent knee exam. No
evidence of fracture of the tibia or fibula. Ankle alignment better
assessed on ankle exam. Generalized soft tissue edema.
IMPRESSION: No acute fracture of the tibia or fibula. Knee and ankle alignment
better assessed on dedicated joint exams performed concurrently.

## 2023-01-17 ENCOUNTER — Emergency Department (HOSPITAL_COMMUNITY)
Admission: EM | Admit: 2023-01-17 | Discharge: 2023-01-17 | Disposition: A | Discharge status: Home / Self Care | Payer: Self-pay | Attending: Emergency Medicine | Admitting: Emergency Medicine

## 2023-01-17 ENCOUNTER — Other Ambulatory Visit: Payer: Self-pay

## 2023-01-17 ENCOUNTER — Emergency Department (HOSPITAL_COMMUNITY): Payer: Self-pay

## 2023-01-17 DIAGNOSIS — S8392XA Sprain of unspecified site of left knee, initial encounter: Secondary | ICD-10-CM | POA: Insufficient documentation

## 2023-01-17 DIAGNOSIS — X501XXA Overexertion from prolonged static or awkward postures, initial encounter: Secondary | ICD-10-CM | POA: Insufficient documentation

## 2023-01-17 DIAGNOSIS — Y9369 Activity, other involving other sports and athletics played as a team or group: Secondary | ICD-10-CM | POA: Insufficient documentation

## 2023-01-17 MED ORDER — DICLOFENAC SODIUM 50 MG PO TBEC: 50.0000 mg | DELAYED_RELEASE_TABLET | Freq: Two times a day (BID) | ORAL | 0 refills | Status: AC

## 2023-01-17 MED ORDER — HYDROCODONE-ACETAMINOPHEN 5-325 MG PO TABS
1.0000 | ORAL_TABLET | Freq: Once | ORAL | Status: AC
  Administered 2023-01-17: 1 via ORAL
  Filled 2023-01-17: qty 1

## 2023-01-17 MED ORDER — DICLOFENAC SODIUM 50 MG PO TBEC: 50.0000 mg | DELAYED_RELEASE_TABLET | Freq: Two times a day (BID) | ORAL | 0 refills | Status: DC

## 2023-01-17 NOTE — Discharge Instructions (Addendum)
Thank you for allowing [place] to be part of your care today.  You were evaluated in the ED for left knee pain following an injury.  Your x-ray is reassuring and does not show any evidence of dislocation, fracture, or fluid in the joint.  You likely have a sprain of your left knee.  You were given a knee brace and crutches to help with support and weightbearing.  I have sent over a prescription for an anti-inflammatory for you to take to help with pain and inflammation.  I do recommend resting your leg, elevating, and applying ice over the next 24 to 48 hours.  I do recommend using ice at least 2-3 times per day for no longer than 20 minutes at a time.  After 2 days, you may transition to heat to help with pain and healing.  You may slowly attempt weightbearing over the next couple of days.  If you are unable to bear weight or have continued pain, I recommend following up with orthopedics as there may be an injury that requires further evaluation or imaging.

## 2023-01-17 NOTE — ED Triage Notes (Signed)
Pt  [place]ampoline [place]. C/o L knee pain after twisting it while playing dodgeball. Full ROM, non-weightbearing.   Given 100 mcg fentanyl  AOx4

## 2023-01-17 NOTE — ED Provider Notes (Addendum)
[place] EMERGENCY DEPARTMENT AT [facility] - Inpatient Provider Note   CSN: [id number] Arrival date & time: 01/17/23  1815     History  Chief Complaint  Patient presents with   Knee Injury    Jill Tran is a 27 y.o. female with past medical history significant for ADHD, mood disorder, knee dislocation presents [place] the ED by ambulance [place] with left knee pain.  Patient states she twisted it while playing dodge ball.  She attempted to put weight on her knee after she fell but it buckled on her and she fell back down.  She states when EMS arrived, they helped her stand, but she could not bear weight.  She reports feeling a "pop" when she attempted bearing weight the 2nd time.  She now feels like her knee is "tight".  Denies hitting her head or losing consciousness, back pain, neck pain, joint swelling.        Home Medications Prior to Admission medications   Medication Sig Start Date End Date Taking? Authorizing Provider  diclofenac (VOLTAREN) 50 MG EC tablet Take 1 tablet (50 mg total) by mouth 2 (two) times daily. 01/17/23  Yes Ramatoulaye Pack R, PA-C  gabapentin (NEURONTIN) 300 MG capsule Take 1 capsule (300 mg total) by mouth 3 (three) times daily. 01/12/20   Vickki Hearing, MD  ibuprofen (ADVIL) 800 MG tablet Take 1 tablet (800 mg total) by mouth every 8 (eight) hours as needed. 01/12/20   Vickki Hearing, MD      Allergies    Patient has no known allergies.    Review of Systems   Review of Systems  Musculoskeletal:  Positive for arthralgias (L knee pain) and gait problem (unable to bear weight on left leg). Negative for back pain, joint swelling and neck pain.  Neurological:  Negative for syncope.    Physical Exam Updated Vital Signs BP (!) 135/102   Pulse 62   Temp 98.4 F (36.9 C) (Oral)   Resp 18   Ht 5\' 2"  (1.575 m)   Wt 113.4 kg   LMP 01/11/2023   SpO2 100%   BMI 45.73 kg/m  Physical Exam Vitals and nursing note reviewed.   Constitutional:      General: She is not in acute distress.    Appearance: Normal appearance. She is not ill-appearing or diaphoretic.  Cardiovascular:     Rate and Rhythm: Normal rate and regular rhythm.  Pulmonary:     Effort: Pulmonary effort is normal.  Musculoskeletal:     Left knee: Swelling (minimal) present. No deformity or erythema. Tenderness present. Normal alignment and normal meniscus. Normal pulse.     Comments: Normal extension of the left leg.  Knee flexion to 90 degrees with passive ROM.  Mildly decreased active ROM.  Popliteal pulse is 2+.  No point tenderness of the ACL, PCL, LCL, MCL.  Mild swelling appreciated without erythema or increased warmth.    Skin:    General: Skin is warm and dry.     Capillary Refill: Capillary refill takes less than 2 seconds.  Neurological:     Mental Status: She is alert. Mental status is at baseline.  Psychiatric:        Mood and Affect: Mood normal.        Behavior: Behavior normal.     ED Results / Procedures / Treatments   Labs (all labs ordered are listed, but only abnormal results are displayed) Labs Reviewed - No data to display  EKG None  Radiology DG Knee Complete 4 Views Left  Result Date: 01/17/2023 CLINICAL DATA:  Knee injury. Patient was at trampoline park tonight and left knee gave out when jumping. EXAM: LEFT KNEE - COMPLETE 4+ VIEW COMPARISON:  None Available. FINDINGS: No evidence of fracture, dislocation, or joint effusion. No evidence of arthropathy or other focal bone abnormality. Soft tissues are unremarkable. IMPRESSION: Negative. Electronically Signed   By: Larose Hires D.O.   On: 01/17/2023 19:08    Procedures Procedures    Medications Ordered in ED Medications  HYDROcodone-acetaminophen (NORCO/VICODIN) 5-325 MG per tablet 1 tablet (has no administration in time range)    ED Course/ Medical Decision Making/ A&P                             Medical Decision Making Amount and/or Complexity of  Data Reviewed Radiology: ordered.  Risk Prescription drug management.   This patient presents to the ED with chief complaint(s) of left knee pain following mechanical fall with pertinent past medical history of knee dislocation.  The complaint involves an extensive differential diagnosis and also carries with it a high risk of complications and morbidity.    The differential diagnosis includes knee sprain, acute dislocation, fracture, joint effusion, contusion, torn ligament  The initial plan is to obtain left knee x-ray  Initial Assessment:   Exam significant for mildly decreased active range of motion of the left knee.  Knee flexion to 90 degrees with passive ROM.  Normal extension of the left leg.  Popliteal pulses 2+.  No point tenderness of the ligaments.  Mild swelling appreciated without erythema or increased warmth.  No obvious deformities.  Independent visualization and interpretation of imaging: I independently visualized the following imaging with scope of interpretation limited to determining acute life threatening conditions related to emergency care: left knee x-ray, which revealed no evidence of acute dislocation, fracture, or joint effusion.  I agree with radiologist interpretation.   Treatment and Reassessment: Patient given knee brace and crutches to help with support and weight bearing.  Patient given single dose of Norco to help with pain.  Disposition:   Suspect patient likely sustained a sprain of her left knee.  Recommended orthopedics follow-up if conservative treatment and time does not improve her pain.  Discussed supportive care measures with patient.  Prescription anti-inflammatory sent to pharmacy.  Work note provided.  The patient has been appropriately medically screened and/or stabilized in the ED. I have low suspicion for any other emergent medical condition which would require further screening, evaluation or treatment in the ED or require inpatient  management. At time of discharge the patient is hemodynamically stable and in no acute distress. I have discussed work-up results and diagnosis with patient and answered all questions. Patient is agreeable with discharge plan. We discussed strict return precautions for returning to the emergency department and they verbalized understanding.            Final Clinical Impression(s) / ED Diagnoses Final diagnoses:  Sprain of left knee, unspecified ligament, initial encounter    Rx / DC Orders ED Discharge Orders          Ordered    diclofenac (VOLTAREN) 50 MG EC tablet  2 times daily        01/17/23 2030              Lenard Simmer, PA-C 01/17/23 2028    Melton Alar R, PA-C 01/17/23  2037    Rozelle Logan, DO 01/17/23 2341
